# Patient Record
Sex: Male | Born: 1935
Health system: Southern US, Community
[De-identification: ages and names within clinical notes are randomized; demographics above are authoritative.]

## PROBLEM LIST (undated history)

## (undated) DIAGNOSIS — I2510 Atherosclerotic heart disease of native coronary artery without angina pectoris: Secondary | ICD-10-CM

## (undated) DIAGNOSIS — I219 Acute myocardial infarction, unspecified: Secondary | ICD-10-CM

## (undated) DIAGNOSIS — N200 Calculus of kidney: Secondary | ICD-10-CM

## (undated) HISTORY — PX: CARDIAC SURGERY: SHX584

## (undated) HISTORY — PX: ABDOMINAL SURGERY: SHX537

---

## 2005-09-14 ENCOUNTER — Emergency Department: Payer: Self-pay | Admitting: Emergency Medicine

## 2007-05-08 ENCOUNTER — Ambulatory Visit: Payer: Self-pay | Admitting: Gastroenterology

## 2007-05-09 ENCOUNTER — Other Ambulatory Visit: Payer: Self-pay

## 2007-05-10 ENCOUNTER — Inpatient Hospital Stay: Payer: Self-pay | Admitting: Internal Medicine

## 2007-05-12 ENCOUNTER — Other Ambulatory Visit: Payer: Self-pay

## 2010-12-31 ENCOUNTER — Ambulatory Visit: Payer: Self-pay | Admitting: Family

## 2011-01-04 ENCOUNTER — Ambulatory Visit: Payer: Self-pay | Admitting: Unknown Physician Specialty

## 2011-08-10 DIAGNOSIS — R972 Elevated prostate specific antigen [PSA]: Secondary | ICD-10-CM | POA: Diagnosis not present

## 2011-08-10 DIAGNOSIS — Z85828 Personal history of other malignant neoplasm of skin: Secondary | ICD-10-CM | POA: Diagnosis not present

## 2011-08-10 DIAGNOSIS — D485 Neoplasm of uncertain behavior of skin: Secondary | ICD-10-CM | POA: Diagnosis not present

## 2011-08-10 DIAGNOSIS — J9819 Other pulmonary collapse: Secondary | ICD-10-CM | POA: Diagnosis not present

## 2011-08-10 DIAGNOSIS — L570 Actinic keratosis: Secondary | ICD-10-CM | POA: Diagnosis not present

## 2011-09-29 DIAGNOSIS — L908 Other atrophic disorders of skin: Secondary | ICD-10-CM | POA: Diagnosis not present

## 2011-09-29 DIAGNOSIS — C44320 Squamous cell carcinoma of skin of unspecified parts of face: Secondary | ICD-10-CM | POA: Diagnosis not present

## 2011-09-29 DIAGNOSIS — Z85828 Personal history of other malignant neoplasm of skin: Secondary | ICD-10-CM | POA: Diagnosis not present

## 2011-11-26 DIAGNOSIS — E785 Hyperlipidemia, unspecified: Secondary | ICD-10-CM | POA: Diagnosis not present

## 2011-11-26 DIAGNOSIS — I10 Essential (primary) hypertension: Secondary | ICD-10-CM | POA: Diagnosis not present

## 2012-02-24 DIAGNOSIS — D649 Anemia, unspecified: Secondary | ICD-10-CM | POA: Diagnosis not present

## 2012-02-24 DIAGNOSIS — E039 Hypothyroidism, unspecified: Secondary | ICD-10-CM | POA: Diagnosis not present

## 2012-02-24 DIAGNOSIS — Z125 Encounter for screening for malignant neoplasm of prostate: Secondary | ICD-10-CM | POA: Diagnosis not present

## 2012-02-24 DIAGNOSIS — E785 Hyperlipidemia, unspecified: Secondary | ICD-10-CM | POA: Diagnosis not present

## 2012-02-24 DIAGNOSIS — E7800 Pure hypercholesterolemia, unspecified: Secondary | ICD-10-CM | POA: Diagnosis not present

## 2012-02-28 DIAGNOSIS — M79609 Pain in unspecified limb: Secondary | ICD-10-CM | POA: Diagnosis not present

## 2012-02-28 DIAGNOSIS — S91009A Unspecified open wound, unspecified ankle, initial encounter: Secondary | ICD-10-CM | POA: Diagnosis not present

## 2012-02-28 DIAGNOSIS — M7989 Other specified soft tissue disorders: Secondary | ICD-10-CM | POA: Diagnosis not present

## 2012-02-29 DIAGNOSIS — I209 Angina pectoris, unspecified: Secondary | ICD-10-CM | POA: Diagnosis not present

## 2012-02-29 DIAGNOSIS — H1045 Other chronic allergic conjunctivitis: Secondary | ICD-10-CM | POA: Diagnosis not present

## 2012-02-29 DIAGNOSIS — E785 Hyperlipidemia, unspecified: Secondary | ICD-10-CM | POA: Diagnosis not present

## 2012-02-29 DIAGNOSIS — I499 Cardiac arrhythmia, unspecified: Secondary | ICD-10-CM | POA: Diagnosis not present

## 2012-04-04 DIAGNOSIS — I499 Cardiac arrhythmia, unspecified: Secondary | ICD-10-CM | POA: Diagnosis not present

## 2012-05-01 DIAGNOSIS — L570 Actinic keratosis: Secondary | ICD-10-CM | POA: Diagnosis not present

## 2012-05-01 DIAGNOSIS — Z85828 Personal history of other malignant neoplasm of skin: Secondary | ICD-10-CM | POA: Diagnosis not present

## 2012-06-26 DIAGNOSIS — D075 Carcinoma in situ of prostate: Secondary | ICD-10-CM | POA: Diagnosis not present

## 2012-06-26 DIAGNOSIS — R972 Elevated prostate specific antigen [PSA]: Secondary | ICD-10-CM | POA: Diagnosis not present

## 2012-06-26 DIAGNOSIS — N400 Enlarged prostate without lower urinary tract symptoms: Secondary | ICD-10-CM | POA: Diagnosis not present

## 2012-07-31 DIAGNOSIS — I499 Cardiac arrhythmia, unspecified: Secondary | ICD-10-CM | POA: Diagnosis not present

## 2012-07-31 DIAGNOSIS — I209 Angina pectoris, unspecified: Secondary | ICD-10-CM | POA: Diagnosis not present

## 2012-07-31 DIAGNOSIS — I10 Essential (primary) hypertension: Secondary | ICD-10-CM | POA: Diagnosis not present

## 2012-07-31 DIAGNOSIS — E785 Hyperlipidemia, unspecified: Secondary | ICD-10-CM | POA: Diagnosis not present

## 2012-08-16 DIAGNOSIS — I499 Cardiac arrhythmia, unspecified: Secondary | ICD-10-CM | POA: Diagnosis not present

## 2012-08-16 DIAGNOSIS — E785 Hyperlipidemia, unspecified: Secondary | ICD-10-CM | POA: Diagnosis not present

## 2012-08-16 DIAGNOSIS — I10 Essential (primary) hypertension: Secondary | ICD-10-CM | POA: Diagnosis not present

## 2012-08-17 DIAGNOSIS — I499 Cardiac arrhythmia, unspecified: Secondary | ICD-10-CM | POA: Diagnosis not present

## 2012-08-30 DIAGNOSIS — H2510 Age-related nuclear cataract, unspecified eye: Secondary | ICD-10-CM | POA: Diagnosis not present

## 2012-11-24 DIAGNOSIS — I10 Essential (primary) hypertension: Secondary | ICD-10-CM | POA: Diagnosis not present

## 2012-11-24 DIAGNOSIS — I209 Angina pectoris, unspecified: Secondary | ICD-10-CM | POA: Diagnosis not present

## 2012-11-24 DIAGNOSIS — E785 Hyperlipidemia, unspecified: Secondary | ICD-10-CM | POA: Diagnosis not present

## 2012-11-24 DIAGNOSIS — I499 Cardiac arrhythmia, unspecified: Secondary | ICD-10-CM | POA: Diagnosis not present

## 2012-11-30 DIAGNOSIS — N410 Acute prostatitis: Secondary | ICD-10-CM | POA: Diagnosis not present

## 2012-11-30 DIAGNOSIS — T887XXA Unspecified adverse effect of drug or medicament, initial encounter: Secondary | ICD-10-CM | POA: Diagnosis not present

## 2012-11-30 DIAGNOSIS — I10 Essential (primary) hypertension: Secondary | ICD-10-CM | POA: Diagnosis not present

## 2012-11-30 DIAGNOSIS — E785 Hyperlipidemia, unspecified: Secondary | ICD-10-CM | POA: Diagnosis not present

## 2012-12-12 DIAGNOSIS — E785 Hyperlipidemia, unspecified: Secondary | ICD-10-CM | POA: Diagnosis not present

## 2012-12-12 DIAGNOSIS — Z0000 Encounter for general adult medical examination without abnormal findings: Secondary | ICD-10-CM | POA: Diagnosis not present

## 2012-12-12 DIAGNOSIS — I2510 Atherosclerotic heart disease of native coronary artery without angina pectoris: Secondary | ICD-10-CM | POA: Diagnosis not present

## 2013-04-09 DIAGNOSIS — I209 Angina pectoris, unspecified: Secondary | ICD-10-CM | POA: Diagnosis not present

## 2013-04-09 DIAGNOSIS — I2510 Atherosclerotic heart disease of native coronary artery without angina pectoris: Secondary | ICD-10-CM | POA: Diagnosis not present

## 2013-04-09 DIAGNOSIS — I10 Essential (primary) hypertension: Secondary | ICD-10-CM | POA: Diagnosis not present

## 2013-04-09 DIAGNOSIS — Z9861 Coronary angioplasty status: Secondary | ICD-10-CM | POA: Diagnosis not present

## 2013-04-20 DIAGNOSIS — R0602 Shortness of breath: Secondary | ICD-10-CM | POA: Diagnosis not present

## 2013-05-01 DIAGNOSIS — L570 Actinic keratosis: Secondary | ICD-10-CM | POA: Diagnosis not present

## 2013-05-01 DIAGNOSIS — Z85828 Personal history of other malignant neoplasm of skin: Secondary | ICD-10-CM | POA: Diagnosis not present

## 2013-05-01 DIAGNOSIS — D485 Neoplasm of uncertain behavior of skin: Secondary | ICD-10-CM | POA: Diagnosis not present

## 2013-05-07 DIAGNOSIS — L570 Actinic keratosis: Secondary | ICD-10-CM | POA: Diagnosis not present

## 2013-06-19 DIAGNOSIS — I499 Cardiac arrhythmia, unspecified: Secondary | ICD-10-CM | POA: Diagnosis not present

## 2013-06-19 DIAGNOSIS — I10 Essential (primary) hypertension: Secondary | ICD-10-CM | POA: Diagnosis not present

## 2013-06-19 DIAGNOSIS — I2510 Atherosclerotic heart disease of native coronary artery without angina pectoris: Secondary | ICD-10-CM | POA: Diagnosis not present

## 2013-06-19 DIAGNOSIS — E785 Hyperlipidemia, unspecified: Secondary | ICD-10-CM | POA: Diagnosis not present

## 2013-06-28 DIAGNOSIS — C44320 Squamous cell carcinoma of skin of unspecified parts of face: Secondary | ICD-10-CM | POA: Diagnosis not present

## 2013-08-21 DIAGNOSIS — E785 Hyperlipidemia, unspecified: Secondary | ICD-10-CM | POA: Diagnosis not present

## 2013-08-21 DIAGNOSIS — I209 Angina pectoris, unspecified: Secondary | ICD-10-CM | POA: Diagnosis not present

## 2013-08-21 DIAGNOSIS — I10 Essential (primary) hypertension: Secondary | ICD-10-CM | POA: Diagnosis not present

## 2013-09-05 DIAGNOSIS — L570 Actinic keratosis: Secondary | ICD-10-CM | POA: Diagnosis not present

## 2013-10-03 DIAGNOSIS — L570 Actinic keratosis: Secondary | ICD-10-CM | POA: Diagnosis not present

## 2013-10-29 DIAGNOSIS — Z85828 Personal history of other malignant neoplasm of skin: Secondary | ICD-10-CM | POA: Diagnosis not present

## 2013-10-29 DIAGNOSIS — D485 Neoplasm of uncertain behavior of skin: Secondary | ICD-10-CM | POA: Diagnosis not present

## 2013-10-29 DIAGNOSIS — L905 Scar conditions and fibrosis of skin: Secondary | ICD-10-CM | POA: Diagnosis not present

## 2013-10-29 DIAGNOSIS — L851 Acquired keratosis [keratoderma] palmaris et plantaris: Secondary | ICD-10-CM | POA: Diagnosis not present

## 2013-10-29 DIAGNOSIS — L821 Other seborrheic keratosis: Secondary | ICD-10-CM | POA: Diagnosis not present

## 2013-11-26 DIAGNOSIS — J218 Acute bronchiolitis due to other specified organisms: Secondary | ICD-10-CM | POA: Diagnosis not present

## 2013-11-26 DIAGNOSIS — J3089 Other allergic rhinitis: Secondary | ICD-10-CM | POA: Diagnosis not present

## 2014-01-16 DIAGNOSIS — D0460 Carcinoma in situ of skin of unspecified upper limb, including shoulder: Secondary | ICD-10-CM | POA: Diagnosis not present

## 2014-02-19 DIAGNOSIS — Z0000 Encounter for general adult medical examination without abnormal findings: Secondary | ICD-10-CM | POA: Diagnosis not present

## 2014-02-19 DIAGNOSIS — E785 Hyperlipidemia, unspecified: Secondary | ICD-10-CM | POA: Diagnosis not present

## 2014-02-19 DIAGNOSIS — R972 Elevated prostate specific antigen [PSA]: Secondary | ICD-10-CM | POA: Diagnosis not present

## 2014-02-26 DIAGNOSIS — I10 Essential (primary) hypertension: Secondary | ICD-10-CM | POA: Diagnosis not present

## 2014-02-26 DIAGNOSIS — I2510 Atherosclerotic heart disease of native coronary artery without angina pectoris: Secondary | ICD-10-CM | POA: Diagnosis not present

## 2014-02-26 DIAGNOSIS — R972 Elevated prostate specific antigen [PSA]: Secondary | ICD-10-CM | POA: Diagnosis not present

## 2014-02-26 DIAGNOSIS — E785 Hyperlipidemia, unspecified: Secondary | ICD-10-CM | POA: Diagnosis not present

## 2014-03-14 DIAGNOSIS — D0460 Carcinoma in situ of skin of unspecified upper limb, including shoulder: Secondary | ICD-10-CM | POA: Diagnosis not present

## 2014-03-14 DIAGNOSIS — R972 Elevated prostate specific antigen [PSA]: Secondary | ICD-10-CM | POA: Diagnosis not present

## 2014-05-30 DIAGNOSIS — I10 Essential (primary) hypertension: Secondary | ICD-10-CM | POA: Diagnosis not present

## 2014-05-30 DIAGNOSIS — I209 Angina pectoris, unspecified: Secondary | ICD-10-CM | POA: Diagnosis not present

## 2014-05-30 DIAGNOSIS — Z2821 Immunization not carried out because of patient refusal: Secondary | ICD-10-CM | POA: Diagnosis not present

## 2014-05-30 DIAGNOSIS — Z1389 Encounter for screening for other disorder: Secondary | ICD-10-CM | POA: Diagnosis not present

## 2014-07-11 DIAGNOSIS — D0439 Carcinoma in situ of skin of other parts of face: Secondary | ICD-10-CM | POA: Diagnosis not present

## 2014-07-11 DIAGNOSIS — D0461 Carcinoma in situ of skin of right upper limb, including shoulder: Secondary | ICD-10-CM | POA: Diagnosis not present

## 2014-07-11 DIAGNOSIS — X32XXXA Exposure to sunlight, initial encounter: Secondary | ICD-10-CM | POA: Diagnosis not present

## 2014-07-11 DIAGNOSIS — D485 Neoplasm of uncertain behavior of skin: Secondary | ICD-10-CM | POA: Diagnosis not present

## 2014-07-11 DIAGNOSIS — Z85828 Personal history of other malignant neoplasm of skin: Secondary | ICD-10-CM | POA: Diagnosis not present

## 2014-07-11 DIAGNOSIS — L570 Actinic keratosis: Secondary | ICD-10-CM | POA: Diagnosis not present

## 2014-08-26 DIAGNOSIS — D0461 Carcinoma in situ of skin of right upper limb, including shoulder: Secondary | ICD-10-CM | POA: Diagnosis not present

## 2014-08-30 DIAGNOSIS — I2510 Atherosclerotic heart disease of native coronary artery without angina pectoris: Secondary | ICD-10-CM | POA: Diagnosis not present

## 2014-08-30 DIAGNOSIS — E784 Other hyperlipidemia: Secondary | ICD-10-CM | POA: Diagnosis not present

## 2014-08-30 DIAGNOSIS — I10 Essential (primary) hypertension: Secondary | ICD-10-CM | POA: Diagnosis not present

## 2014-08-30 DIAGNOSIS — I209 Angina pectoris, unspecified: Secondary | ICD-10-CM | POA: Diagnosis not present

## 2014-09-12 DIAGNOSIS — L570 Actinic keratosis: Secondary | ICD-10-CM | POA: Diagnosis not present

## 2014-09-12 DIAGNOSIS — L578 Other skin changes due to chronic exposure to nonionizing radiation: Secondary | ICD-10-CM | POA: Diagnosis not present

## 2014-09-12 DIAGNOSIS — Z85828 Personal history of other malignant neoplasm of skin: Secondary | ICD-10-CM | POA: Diagnosis not present

## 2014-09-12 DIAGNOSIS — D099 Carcinoma in situ, unspecified: Secondary | ICD-10-CM | POA: Diagnosis not present

## 2014-09-23 DIAGNOSIS — R972 Elevated prostate specific antigen [PSA]: Secondary | ICD-10-CM | POA: Diagnosis not present

## 2014-09-23 DIAGNOSIS — Z6828 Body mass index (BMI) 28.0-28.9, adult: Secondary | ICD-10-CM | POA: Diagnosis not present

## 2014-09-23 DIAGNOSIS — N423 Dysplasia of prostate: Secondary | ICD-10-CM | POA: Diagnosis not present

## 2014-09-30 DIAGNOSIS — I10 Essential (primary) hypertension: Secondary | ICD-10-CM | POA: Diagnosis not present

## 2014-09-30 DIAGNOSIS — I2510 Atherosclerotic heart disease of native coronary artery without angina pectoris: Secondary | ICD-10-CM | POA: Diagnosis not present

## 2014-09-30 DIAGNOSIS — I209 Angina pectoris, unspecified: Secondary | ICD-10-CM | POA: Diagnosis not present

## 2014-10-25 DIAGNOSIS — Z23 Encounter for immunization: Secondary | ICD-10-CM | POA: Diagnosis not present

## 2014-12-30 DIAGNOSIS — I209 Angina pectoris, unspecified: Secondary | ICD-10-CM | POA: Diagnosis not present

## 2014-12-30 DIAGNOSIS — I2510 Atherosclerotic heart disease of native coronary artery without angina pectoris: Secondary | ICD-10-CM | POA: Diagnosis not present

## 2014-12-30 DIAGNOSIS — I10 Essential (primary) hypertension: Secondary | ICD-10-CM | POA: Diagnosis not present

## 2014-12-31 DIAGNOSIS — Z85828 Personal history of other malignant neoplasm of skin: Secondary | ICD-10-CM | POA: Diagnosis not present

## 2014-12-31 DIAGNOSIS — L570 Actinic keratosis: Secondary | ICD-10-CM | POA: Diagnosis not present

## 2014-12-31 DIAGNOSIS — X32XXXA Exposure to sunlight, initial encounter: Secondary | ICD-10-CM | POA: Diagnosis not present

## 2014-12-31 DIAGNOSIS — D2261 Melanocytic nevi of right upper limb, including shoulder: Secondary | ICD-10-CM | POA: Diagnosis not present

## 2014-12-31 DIAGNOSIS — D2272 Melanocytic nevi of left lower limb, including hip: Secondary | ICD-10-CM | POA: Diagnosis not present

## 2014-12-31 DIAGNOSIS — L821 Other seborrheic keratosis: Secondary | ICD-10-CM | POA: Diagnosis not present

## 2015-01-06 DIAGNOSIS — R972 Elevated prostate specific antigen [PSA]: Secondary | ICD-10-CM | POA: Diagnosis not present

## 2015-04-07 DIAGNOSIS — I2510 Atherosclerotic heart disease of native coronary artery without angina pectoris: Secondary | ICD-10-CM | POA: Diagnosis not present

## 2015-04-07 DIAGNOSIS — I209 Angina pectoris, unspecified: Secondary | ICD-10-CM | POA: Diagnosis not present

## 2015-04-07 DIAGNOSIS — Z23 Encounter for immunization: Secondary | ICD-10-CM | POA: Diagnosis not present

## 2015-04-10 DIAGNOSIS — E784 Other hyperlipidemia: Secondary | ICD-10-CM | POA: Diagnosis not present

## 2015-04-10 DIAGNOSIS — Z125 Encounter for screening for malignant neoplasm of prostate: Secondary | ICD-10-CM | POA: Diagnosis not present

## 2015-04-10 DIAGNOSIS — I10 Essential (primary) hypertension: Secondary | ICD-10-CM | POA: Diagnosis not present

## 2015-04-21 DIAGNOSIS — I10 Essential (primary) hypertension: Secondary | ICD-10-CM | POA: Diagnosis not present

## 2015-04-21 DIAGNOSIS — I498 Other specified cardiac arrhythmias: Secondary | ICD-10-CM | POA: Diagnosis not present

## 2015-04-21 DIAGNOSIS — I2510 Atherosclerotic heart disease of native coronary artery without angina pectoris: Secondary | ICD-10-CM | POA: Diagnosis not present

## 2015-04-21 DIAGNOSIS — E784 Other hyperlipidemia: Secondary | ICD-10-CM | POA: Diagnosis not present

## 2015-05-05 DIAGNOSIS — Z0000 Encounter for general adult medical examination without abnormal findings: Secondary | ICD-10-CM | POA: Diagnosis not present

## 2015-05-14 DIAGNOSIS — J069 Acute upper respiratory infection, unspecified: Secondary | ICD-10-CM | POA: Diagnosis not present

## 2015-05-14 DIAGNOSIS — H6122 Impacted cerumen, left ear: Secondary | ICD-10-CM | POA: Diagnosis not present

## 2015-05-14 DIAGNOSIS — R0982 Postnasal drip: Secondary | ICD-10-CM | POA: Diagnosis not present

## 2015-07-08 DIAGNOSIS — Z08 Encounter for follow-up examination after completed treatment for malignant neoplasm: Secondary | ICD-10-CM | POA: Diagnosis not present

## 2015-07-08 DIAGNOSIS — L820 Inflamed seborrheic keratosis: Secondary | ICD-10-CM | POA: Diagnosis not present

## 2015-07-08 DIAGNOSIS — Z85828 Personal history of other malignant neoplasm of skin: Secondary | ICD-10-CM | POA: Diagnosis not present

## 2015-07-08 DIAGNOSIS — D485 Neoplasm of uncertain behavior of skin: Secondary | ICD-10-CM | POA: Diagnosis not present

## 2015-07-08 DIAGNOSIS — X32XXXA Exposure to sunlight, initial encounter: Secondary | ICD-10-CM | POA: Diagnosis not present

## 2015-07-08 DIAGNOSIS — L570 Actinic keratosis: Secondary | ICD-10-CM | POA: Diagnosis not present

## 2015-08-05 DIAGNOSIS — I2510 Atherosclerotic heart disease of native coronary artery without angina pectoris: Secondary | ICD-10-CM | POA: Diagnosis not present

## 2015-08-05 DIAGNOSIS — I10 Essential (primary) hypertension: Secondary | ICD-10-CM | POA: Diagnosis not present

## 2015-08-05 DIAGNOSIS — E784 Other hyperlipidemia: Secondary | ICD-10-CM | POA: Diagnosis not present

## 2015-08-05 DIAGNOSIS — I209 Angina pectoris, unspecified: Secondary | ICD-10-CM | POA: Diagnosis not present

## 2015-08-12 DIAGNOSIS — I119 Hypertensive heart disease without heart failure: Secondary | ICD-10-CM | POA: Diagnosis not present

## 2015-08-12 DIAGNOSIS — I209 Angina pectoris, unspecified: Secondary | ICD-10-CM | POA: Diagnosis not present

## 2015-08-12 DIAGNOSIS — E784 Other hyperlipidemia: Secondary | ICD-10-CM | POA: Diagnosis not present

## 2015-08-12 DIAGNOSIS — I429 Cardiomyopathy, unspecified: Secondary | ICD-10-CM | POA: Diagnosis not present

## 2015-08-12 DIAGNOSIS — I10 Essential (primary) hypertension: Secondary | ICD-10-CM | POA: Diagnosis not present

## 2015-08-14 DIAGNOSIS — I209 Angina pectoris, unspecified: Secondary | ICD-10-CM | POA: Diagnosis not present

## 2015-08-14 DIAGNOSIS — E784 Other hyperlipidemia: Secondary | ICD-10-CM | POA: Diagnosis not present

## 2015-08-14 DIAGNOSIS — I429 Cardiomyopathy, unspecified: Secondary | ICD-10-CM | POA: Diagnosis not present

## 2015-08-14 DIAGNOSIS — I119 Hypertensive heart disease without heart failure: Secondary | ICD-10-CM | POA: Diagnosis not present

## 2015-09-01 DIAGNOSIS — I209 Angina pectoris, unspecified: Secondary | ICD-10-CM | POA: Diagnosis not present

## 2015-09-01 DIAGNOSIS — B9689 Other specified bacterial agents as the cause of diseases classified elsewhere: Secondary | ICD-10-CM | POA: Diagnosis not present

## 2015-09-01 DIAGNOSIS — J028 Acute pharyngitis due to other specified organisms: Secondary | ICD-10-CM | POA: Diagnosis not present

## 2015-09-01 DIAGNOSIS — I2510 Atherosclerotic heart disease of native coronary artery without angina pectoris: Secondary | ICD-10-CM | POA: Diagnosis not present

## 2015-10-21 DIAGNOSIS — N4231 Prostatic intraepithelial neoplasia: Secondary | ICD-10-CM | POA: Diagnosis not present

## 2015-10-21 DIAGNOSIS — R972 Elevated prostate specific antigen [PSA]: Secondary | ICD-10-CM | POA: Diagnosis not present

## 2015-10-21 DIAGNOSIS — Z6829 Body mass index (BMI) 29.0-29.9, adult: Secondary | ICD-10-CM | POA: Diagnosis not present

## 2015-11-11 DIAGNOSIS — I10 Essential (primary) hypertension: Secondary | ICD-10-CM | POA: Diagnosis not present

## 2015-11-11 DIAGNOSIS — L439 Lichen planus, unspecified: Secondary | ICD-10-CM | POA: Diagnosis not present

## 2015-11-11 DIAGNOSIS — I2510 Atherosclerotic heart disease of native coronary artery without angina pectoris: Secondary | ICD-10-CM | POA: Diagnosis not present

## 2015-11-11 DIAGNOSIS — I209 Angina pectoris, unspecified: Secondary | ICD-10-CM | POA: Diagnosis not present

## 2016-02-25 DIAGNOSIS — R972 Elevated prostate specific antigen [PSA]: Secondary | ICD-10-CM | POA: Diagnosis not present

## 2016-03-01 DIAGNOSIS — I10 Essential (primary) hypertension: Secondary | ICD-10-CM | POA: Diagnosis not present

## 2016-03-01 DIAGNOSIS — I209 Angina pectoris, unspecified: Secondary | ICD-10-CM | POA: Diagnosis not present

## 2016-03-01 DIAGNOSIS — E784 Other hyperlipidemia: Secondary | ICD-10-CM | POA: Diagnosis not present

## 2016-03-01 DIAGNOSIS — J399 Disease of upper respiratory tract, unspecified: Secondary | ICD-10-CM | POA: Diagnosis not present

## 2016-03-10 DIAGNOSIS — H90A22 Sensorineural hearing loss, unilateral, left ear, with restricted hearing on the contralateral side: Secondary | ICD-10-CM | POA: Diagnosis not present

## 2016-03-10 DIAGNOSIS — H6122 Impacted cerumen, left ear: Secondary | ICD-10-CM | POA: Diagnosis not present

## 2016-04-21 DIAGNOSIS — X32XXXA Exposure to sunlight, initial encounter: Secondary | ICD-10-CM | POA: Diagnosis not present

## 2016-04-21 DIAGNOSIS — D0439 Carcinoma in situ of skin of other parts of face: Secondary | ICD-10-CM | POA: Diagnosis not present

## 2016-04-21 DIAGNOSIS — D485 Neoplasm of uncertain behavior of skin: Secondary | ICD-10-CM | POA: Diagnosis not present

## 2016-04-21 DIAGNOSIS — Z08 Encounter for follow-up examination after completed treatment for malignant neoplasm: Secondary | ICD-10-CM | POA: Diagnosis not present

## 2016-04-21 DIAGNOSIS — Z85828 Personal history of other malignant neoplasm of skin: Secondary | ICD-10-CM | POA: Diagnosis not present

## 2016-04-21 DIAGNOSIS — L570 Actinic keratosis: Secondary | ICD-10-CM | POA: Diagnosis not present

## 2016-05-20 DIAGNOSIS — L905 Scar conditions and fibrosis of skin: Secondary | ICD-10-CM | POA: Diagnosis not present

## 2016-05-20 DIAGNOSIS — D0439 Carcinoma in situ of skin of other parts of face: Secondary | ICD-10-CM | POA: Diagnosis not present

## 2016-05-31 DIAGNOSIS — I10 Essential (primary) hypertension: Secondary | ICD-10-CM | POA: Diagnosis not present

## 2016-05-31 DIAGNOSIS — E784 Other hyperlipidemia: Secondary | ICD-10-CM | POA: Diagnosis not present

## 2016-05-31 DIAGNOSIS — I2510 Atherosclerotic heart disease of native coronary artery without angina pectoris: Secondary | ICD-10-CM | POA: Diagnosis not present

## 2016-05-31 DIAGNOSIS — I498 Other specified cardiac arrhythmias: Secondary | ICD-10-CM | POA: Diagnosis not present

## 2016-06-13 ENCOUNTER — Encounter: Payer: Self-pay | Admitting: Emergency Medicine

## 2016-06-13 ENCOUNTER — Emergency Department
Admission: EM | Admit: 2016-06-13 | Discharge: 2016-06-13 | Disposition: A | Discharge status: Home / Self Care | Payer: Medicare Other | Attending: Emergency Medicine | Admitting: Emergency Medicine

## 2016-06-13 ENCOUNTER — Other Ambulatory Visit: Payer: Self-pay

## 2016-06-13 ENCOUNTER — Emergency Department: Payer: Medicare Other

## 2016-06-13 DIAGNOSIS — I2510 Atherosclerotic heart disease of native coronary artery without angina pectoris: Secondary | ICD-10-CM | POA: Diagnosis not present

## 2016-06-13 DIAGNOSIS — R42 Dizziness and giddiness: Secondary | ICD-10-CM | POA: Diagnosis not present

## 2016-06-13 DIAGNOSIS — R112 Nausea with vomiting, unspecified: Secondary | ICD-10-CM | POA: Insufficient documentation

## 2016-06-13 HISTORY — DX: Acute myocardial infarction, unspecified: I21.9

## 2016-06-13 HISTORY — DX: Calculus of kidney: N20.0

## 2016-06-13 HISTORY — DX: Atherosclerotic heart disease of native coronary artery without angina pectoris: I25.10

## 2016-06-13 LAB — BASIC METABOLIC PANEL
ANION GAP: 7 (ref 5–15)
BUN: 12 mg/dL (ref 6–20)
CHLORIDE: 106 mmol/L (ref 101–111)
CO2: 26 mmol/L (ref 22–32)
Calcium: 9.5 mg/dL (ref 8.9–10.3)
Creatinine, Ser: 0.92 mg/dL (ref 0.61–1.24)
GFR calc Af Amer: >60 mL/min (ref >60)
GLUCOSE: 118 mg/dL — AB (ref 65–99)
POTASSIUM: 4.6 mmol/L (ref 3.5–5.1)
Sodium: 139 mmol/L (ref 135–145)

## 2016-06-13 LAB — CBC
HEMATOCRIT: 48.3 % (ref 40.0–52.0)
HEMOGLOBIN: 16.7 g/dL (ref 13.0–18.0)
MCH: 33.7 pg (ref 26.0–34.0)
MCHC: 34.6 g/dL (ref 32.0–36.0)
MCV: 97.4 fL (ref 80.0–100.0)
Platelets: 241 10*3/uL (ref 150–440)
RBC: 4.96 MIL/uL (ref 4.40–5.90)
RDW: 13.7 % (ref 11.5–14.5)
WBC: 6.4 10*3/uL (ref 3.8–10.6)

## 2016-06-13 MED ORDER — MECLIZINE HCL 25 MG PO TABS: 25.0000 mg | ORAL_TABLET | Freq: Three times a day (TID) | ORAL | 0 refills | Status: DC | PRN

## 2016-06-13 MED ORDER — MECLIZINE HCL 25 MG PO TABS
25.0000 mg | ORAL_TABLET | Freq: Once | ORAL | Status: AC
  Administered 2016-06-13: 25 mg via ORAL
  Filled 2016-06-13: qty 1

## 2016-06-13 MED ORDER — ONDANSETRON HCL 4 MG/2ML IJ SOLN
4.0000 mg | Freq: Once | INTRAMUSCULAR | Status: AC
  Administered 2016-06-13: 4 mg via INTRAVENOUS

## 2016-06-13 MED ORDER — ONDANSETRON HCL 4 MG/2ML IJ SOLN
INTRAMUSCULAR | Status: AC
  Administered 2016-06-13: 4 mg via INTRAVENOUS
  Filled 2016-06-13: qty 2

## 2016-06-13 MED ORDER — SODIUM CHLORIDE 0.9 % IV SOLN
1000.0000 mL | Freq: Once | INTRAVENOUS | Status: AC
  Administered 2016-06-13: 1000 mL via INTRAVENOUS

## 2016-06-13 NOTE — ED Notes (Signed)
Pt ambulatory with 1 assist, pt denies any dizziness or nausea . MD notified

## 2016-06-13 NOTE — ED Notes (Addendum)
Pt c/o dizziness with movement, nausea and vomiting started aprox 5am. Pt had episode of dizziness last week but resolved on its own. Pt A&Ox4. Pt currently vomiting, verbal order given for zofran from ED provider

## 2016-06-13 NOTE — ED Notes (Signed)
Pt states nausea has improved from zofran given

## 2016-06-13 NOTE — ED Provider Notes (Signed)
[facility] Emergency Department Provider Note   ____________________________________________    I have reviewed the triage vital signs and the nursing notes.   HISTORY  Chief Complaint Dizziness and Emesis     HPI Travis Moore is a 82 y.o. male who presents with complaints of dizziness. Patient reports when he woke up this morning he was unable to get out of bed because he felt lightheaded and like the room was spinning. He decided to lie back down for another hour and then he was able to get up and felt better. He ate breakfast but then reports the sensation of spinning started again. He has never had vertigo before. He denies headache. No neuro deficits. No fevers or chills. No neck pain.   Past Medical History:  Diagnosis Date  . Coronary artery disease   . Myocardial infarction   . Nephrolithiasis     There are no active problems to display for this patient.   Past Surgical History:  Procedure Laterality Date  . ABDOMINAL SURGERY    . CARDIAC SURGERY      Prior to Admission medications   Medication Sig Start Date End Date Taking? Authorizing Provider  meclizine (ANTIVERT) 25 MG tablet Take 1 tablet (25 mg total) by mouth 3 (three) times daily as needed for dizziness. 06/13/16   Lavonia Drafts, MD     Allergies Patient has no known allergies.  History reviewed. No pertinent family history.  Social History Social History  Substance Use Topics  . Smoking status: Never Smoker  . Smokeless tobacco: Never Used  . Alcohol use No    Review of Systems  Constitutional: No fever/chills Eyes: No visual changes.  ENT: No Neck pain Cardiovascular: Denies chest pain. No palpitations Respiratory: Denies shortness of breath. Gastrointestinal: One episode of vomiting  Musculoskeletal: Negative for back pain. Skin: Negative for rash. Neurological: Negative for headaches or weakness  10-point ROS otherwise  negative.  ____________________________________________   PHYSICAL EXAM:  VITAL SIGNS: ED Triage Vitals  Enc Vitals Group     BP 06/13/16 1230 124/80     Pulse Rate 06/13/16 1245 63     Resp 06/13/16 1216 (!) 24     Temp --      Temp src --      SpO2 06/13/16 1245 96 %     Weight 06/13/16 1158 213 lb (96.6 kg)     Height 06/13/16 1158 5\' 10"  (1.778 m)     Head Circumference --      Peak Flow --      Pain Score 06/13/16 1158 0     Pain Loc --      Pain Edu? --      Excl. in [place]? --     Constitutional: Alert and oriented. No acute distress. Pleasant and interactive Eyes: Conjunctivae are normal. PERRLA, EOMI Nose: No congestion/rhinnorhea. Mouth/Throat: Mucous membranes are moist.   Neck:  Painless ROM Cardiovascular: Normal rate, regular rhythm. Grossly normal heart sounds.  Good peripheral circulation. Respiratory: Normal respiratory effort.  No retractions. Lungs CTAB. Gastrointestinal: Soft and nontender. No distention.   Genitourinary: deferred Musculoskeletal: No lower extremity tenderness Warm and well perfused Neurologic:  Normal speech and language. No gross focal neurologic deficits are appreciated. CN 2-12 normal Skin:  Skin is warm, dry and intact. No rash noted. Psychiatric: Mood and affect are normal. Speech and behavior are normal.  ____________________________________________   LABS (all labs ordered are listed, but only abnormal results are  displayed)  Labs Reviewed  BASIC METABOLIC PANEL - Abnormal; Notable for the following:       Result Value   Glucose, Bld 118 (*)    All other components within normal limits  CBC  URINALYSIS, COMPLETE (UACMP) WITH MICROSCOPIC   ____________________________________________  EKG  ED ECG REPORT I, Lavonia Drafts, the attending physician, personally viewed and interpreted this ECG.  Date: 06/13/2016 EKG Time: 12:03 pm Rate: 62 Rhythm: normal sinus rhythm QRS Axis: normal Intervals: normal ST/T Wave  abnormalities: normal Conduction Disturbances: Right bundle branch block   ____________________________________________  RADIOLOGY  CT head unremarkable ____________________________________________   PROCEDURES  Procedure(s) performed: No    Critical Care performed:No ____________________________________________   INITIAL IMPRESSION / ASSESSMENT AND PLAN / ED COURSE  Pertinent labs & imaging results that were available during my care of the patient were reviewed by me and considered in my medical decision making (see chart for details).  Patient overall well-appearing. Presents with dizziness/vertigo with an episode of vomiting. Differential includes BPV versus CVA versus idiopathic dizziness. No neuro deficits. We will treat with meclizine and IV fluids and reevaluate  Clinical Course   ----------------------------------------- 2:28 PM on 06/13/2016 -----------------------------------------  Patient reports he feels significantly better and is requesting to be discharged. He was able to ambulate easily in front of the nurse. I offered admission to the patient but he reports since he is feeling better he wants to go home. I feel this is reasonable given that he agrees to return immediately if the symptoms return. ____________________________________________   FINAL CLINICAL IMPRESSION(S) / ED DIAGNOSES  Final diagnoses:  Vertigo      NEW MEDICATIONS STARTED DURING THIS VISIT:  New Prescriptions   MECLIZINE (ANTIVERT) 25 MG TABLET    Take 1 tablet (25 mg total) by mouth 3 (three) times daily as needed for dizziness.     Note:  This document was prepared using Dragon voice recognition software and may include unintentional dictation errors.    Lavonia Drafts, MD 06/13/16 [phone]

## 2016-06-13 NOTE — ED Triage Notes (Signed)
Pt here with wife, reports dizziness and nausea since this morning, but previously was nauseated and dizzy one week ago.  Grandson last week also had diarrhea.  Pt reports dizziness worse with movement. Wife reports patient vomited up breakfast and had some blood tinge in emesis.

## 2016-06-13 NOTE — ED Notes (Signed)
Patient transported to CT 

## 2016-07-14 DIAGNOSIS — R42 Dizziness and giddiness: Secondary | ICD-10-CM | POA: Diagnosis not present

## 2016-07-20 DIAGNOSIS — R42 Dizziness and giddiness: Secondary | ICD-10-CM | POA: Diagnosis not present

## 2016-08-09 DIAGNOSIS — R42 Dizziness and giddiness: Secondary | ICD-10-CM | POA: Diagnosis not present

## 2016-08-11 DIAGNOSIS — H8192 Unspecified disorder of vestibular function, left ear: Secondary | ICD-10-CM | POA: Diagnosis not present

## 2016-08-30 DIAGNOSIS — I498 Other specified cardiac arrhythmias: Secondary | ICD-10-CM | POA: Diagnosis not present

## 2016-08-30 DIAGNOSIS — R0789 Other chest pain: Secondary | ICD-10-CM | POA: Diagnosis not present

## 2016-08-30 DIAGNOSIS — I2510 Atherosclerotic heart disease of native coronary artery without angina pectoris: Secondary | ICD-10-CM | POA: Diagnosis not present

## 2016-08-30 DIAGNOSIS — J399 Disease of upper respiratory tract, unspecified: Secondary | ICD-10-CM | POA: Diagnosis not present

## 2016-09-13 DIAGNOSIS — I498 Other specified cardiac arrhythmias: Secondary | ICD-10-CM | POA: Diagnosis not present

## 2016-09-13 DIAGNOSIS — J399 Disease of upper respiratory tract, unspecified: Secondary | ICD-10-CM | POA: Diagnosis not present

## 2016-09-13 DIAGNOSIS — I2510 Atherosclerotic heart disease of native coronary artery without angina pectoris: Secondary | ICD-10-CM | POA: Diagnosis not present

## 2016-09-13 DIAGNOSIS — R0789 Other chest pain: Secondary | ICD-10-CM | POA: Diagnosis not present

## 2016-09-15 DIAGNOSIS — I498 Other specified cardiac arrhythmias: Secondary | ICD-10-CM | POA: Diagnosis not present

## 2016-09-15 DIAGNOSIS — I209 Angina pectoris, unspecified: Secondary | ICD-10-CM | POA: Diagnosis not present

## 2016-09-15 DIAGNOSIS — R0602 Shortness of breath: Secondary | ICD-10-CM | POA: Diagnosis not present

## 2016-09-15 DIAGNOSIS — E784 Other hyperlipidemia: Secondary | ICD-10-CM | POA: Diagnosis not present

## 2016-09-22 DIAGNOSIS — L538 Other specified erythematous conditions: Secondary | ICD-10-CM | POA: Diagnosis not present

## 2016-09-22 DIAGNOSIS — L821 Other seborrheic keratosis: Secondary | ICD-10-CM | POA: Diagnosis not present

## 2016-09-22 DIAGNOSIS — D225 Melanocytic nevi of trunk: Secondary | ICD-10-CM | POA: Diagnosis not present

## 2016-09-22 DIAGNOSIS — X32XXXA Exposure to sunlight, initial encounter: Secondary | ICD-10-CM | POA: Diagnosis not present

## 2016-09-22 DIAGNOSIS — D485 Neoplasm of uncertain behavior of skin: Secondary | ICD-10-CM | POA: Diagnosis not present

## 2016-09-22 DIAGNOSIS — L570 Actinic keratosis: Secondary | ICD-10-CM | POA: Diagnosis not present

## 2016-09-22 DIAGNOSIS — C44329 Squamous cell carcinoma of skin of other parts of face: Secondary | ICD-10-CM | POA: Diagnosis not present

## 2016-09-22 DIAGNOSIS — L820 Inflamed seborrheic keratosis: Secondary | ICD-10-CM | POA: Diagnosis not present

## 2016-09-22 DIAGNOSIS — Z85828 Personal history of other malignant neoplasm of skin: Secondary | ICD-10-CM | POA: Diagnosis not present

## 2016-09-22 DIAGNOSIS — D2261 Melanocytic nevi of right upper limb, including shoulder: Secondary | ICD-10-CM | POA: Diagnosis not present

## 2016-10-20 DIAGNOSIS — R972 Elevated prostate specific antigen [PSA]: Secondary | ICD-10-CM | POA: Diagnosis not present

## 2016-10-20 DIAGNOSIS — N4231 Prostatic intraepithelial neoplasia: Secondary | ICD-10-CM | POA: Diagnosis not present

## 2016-10-20 DIAGNOSIS — Z6829 Body mass index (BMI) 29.0-29.9, adult: Secondary | ICD-10-CM | POA: Diagnosis not present

## 2016-11-17 DIAGNOSIS — C44329 Squamous cell carcinoma of skin of other parts of face: Secondary | ICD-10-CM | POA: Diagnosis not present

## 2016-11-17 DIAGNOSIS — D0422 Carcinoma in situ of skin of left ear and external auricular canal: Secondary | ICD-10-CM | POA: Diagnosis not present

## 2016-11-17 DIAGNOSIS — L905 Scar conditions and fibrosis of skin: Secondary | ICD-10-CM | POA: Diagnosis not present

## 2016-12-15 DIAGNOSIS — J00 Acute nasopharyngitis [common cold]: Secondary | ICD-10-CM | POA: Diagnosis not present

## 2016-12-15 DIAGNOSIS — J399 Disease of upper respiratory tract, unspecified: Secondary | ICD-10-CM | POA: Diagnosis not present

## 2016-12-15 DIAGNOSIS — I2510 Atherosclerotic heart disease of native coronary artery without angina pectoris: Secondary | ICD-10-CM | POA: Diagnosis not present

## 2016-12-15 DIAGNOSIS — I10 Essential (primary) hypertension: Secondary | ICD-10-CM | POA: Diagnosis not present

## 2017-02-16 DIAGNOSIS — Z08 Encounter for follow-up examination after completed treatment for malignant neoplasm: Secondary | ICD-10-CM | POA: Diagnosis not present

## 2017-02-16 DIAGNOSIS — L570 Actinic keratosis: Secondary | ICD-10-CM | POA: Diagnosis not present

## 2017-02-16 DIAGNOSIS — L821 Other seborrheic keratosis: Secondary | ICD-10-CM | POA: Diagnosis not present

## 2017-02-16 DIAGNOSIS — X32XXXA Exposure to sunlight, initial encounter: Secondary | ICD-10-CM | POA: Diagnosis not present

## 2017-02-16 DIAGNOSIS — Z85828 Personal history of other malignant neoplasm of skin: Secondary | ICD-10-CM | POA: Diagnosis not present

## 2017-02-28 DIAGNOSIS — R972 Elevated prostate specific antigen [PSA]: Secondary | ICD-10-CM | POA: Diagnosis not present

## 2017-03-16 ENCOUNTER — Ambulatory Visit
Admission: RE | Admit: 2017-03-16 | Discharge: 2017-03-16 | Disposition: A | Discharge status: Home / Self Care | Payer: Medicare Other | Source: Ambulatory Visit | Attending: Internal Medicine | Admitting: Internal Medicine

## 2017-03-16 ENCOUNTER — Other Ambulatory Visit: Payer: Self-pay | Admitting: Internal Medicine

## 2017-03-16 DIAGNOSIS — E784 Other hyperlipidemia: Secondary | ICD-10-CM | POA: Diagnosis not present

## 2017-03-16 DIAGNOSIS — R0602 Shortness of breath: Secondary | ICD-10-CM

## 2017-03-16 DIAGNOSIS — I498 Other specified cardiac arrhythmias: Secondary | ICD-10-CM | POA: Diagnosis not present

## 2017-03-16 DIAGNOSIS — J399 Disease of upper respiratory tract, unspecified: Secondary | ICD-10-CM | POA: Diagnosis not present

## 2017-03-16 DIAGNOSIS — I700 Atherosclerosis of aorta: Secondary | ICD-10-CM | POA: Insufficient documentation

## 2017-03-16 DIAGNOSIS — I10 Essential (primary) hypertension: Secondary | ICD-10-CM | POA: Diagnosis not present

## 2017-03-16 DIAGNOSIS — R918 Other nonspecific abnormal finding of lung field: Secondary | ICD-10-CM | POA: Diagnosis not present

## 2017-03-16 DIAGNOSIS — R5381 Other malaise: Secondary | ICD-10-CM | POA: Diagnosis not present

## 2017-03-16 DIAGNOSIS — I2510 Atherosclerotic heart disease of native coronary artery without angina pectoris: Secondary | ICD-10-CM | POA: Diagnosis not present

## 2017-03-16 DIAGNOSIS — Z125 Encounter for screening for malignant neoplasm of prostate: Secondary | ICD-10-CM | POA: Diagnosis not present

## 2017-06-24 DIAGNOSIS — I2510 Atherosclerotic heart disease of native coronary artery without angina pectoris: Secondary | ICD-10-CM | POA: Diagnosis not present

## 2017-06-24 DIAGNOSIS — I498 Other specified cardiac arrhythmias: Secondary | ICD-10-CM | POA: Diagnosis not present

## 2017-06-24 DIAGNOSIS — I10 Essential (primary) hypertension: Secondary | ICD-10-CM | POA: Diagnosis not present

## 2017-06-24 DIAGNOSIS — J399 Disease of upper respiratory tract, unspecified: Secondary | ICD-10-CM | POA: Diagnosis not present

## 2017-08-18 DIAGNOSIS — D485 Neoplasm of uncertain behavior of skin: Secondary | ICD-10-CM | POA: Diagnosis not present

## 2017-08-18 DIAGNOSIS — X32XXXA Exposure to sunlight, initial encounter: Secondary | ICD-10-CM | POA: Diagnosis not present

## 2017-08-18 DIAGNOSIS — D0439 Carcinoma in situ of skin of other parts of face: Secondary | ICD-10-CM | POA: Diagnosis not present

## 2017-08-18 DIAGNOSIS — Z08 Encounter for follow-up examination after completed treatment for malignant neoplasm: Secondary | ICD-10-CM | POA: Diagnosis not present

## 2017-08-18 DIAGNOSIS — Z85828 Personal history of other malignant neoplasm of skin: Secondary | ICD-10-CM | POA: Diagnosis not present

## 2017-08-18 DIAGNOSIS — L821 Other seborrheic keratosis: Secondary | ICD-10-CM | POA: Diagnosis not present

## 2017-08-18 DIAGNOSIS — L570 Actinic keratosis: Secondary | ICD-10-CM | POA: Diagnosis not present

## 2017-09-22 DIAGNOSIS — I10 Essential (primary) hypertension: Secondary | ICD-10-CM | POA: Diagnosis not present

## 2017-09-22 DIAGNOSIS — E785 Hyperlipidemia, unspecified: Secondary | ICD-10-CM | POA: Diagnosis not present

## 2017-09-22 DIAGNOSIS — I2510 Atherosclerotic heart disease of native coronary artery without angina pectoris: Secondary | ICD-10-CM | POA: Diagnosis not present

## 2017-09-22 DIAGNOSIS — I498 Other specified cardiac arrhythmias: Secondary | ICD-10-CM | POA: Diagnosis not present

## 2017-10-11 ENCOUNTER — Ambulatory Visit: Payer: Self-pay | Admitting: Urology

## 2017-10-18 DIAGNOSIS — D0439 Carcinoma in situ of skin of other parts of face: Secondary | ICD-10-CM | POA: Diagnosis not present

## 2017-10-27 ENCOUNTER — Ambulatory Visit (INDEPENDENT_AMBULATORY_CARE_PROVIDER_SITE_OTHER): Payer: Medicare Other | Admitting: Urology

## 2017-10-27 ENCOUNTER — Encounter: Payer: Self-pay | Admitting: Urology

## 2017-10-27 VITALS — BP 100/63 | HR 86 | Resp 16 | Ht 70.0 in | Wt 214.2 lb

## 2017-10-27 DIAGNOSIS — N4231 Prostatic intraepithelial neoplasia: Secondary | ICD-10-CM | POA: Diagnosis not present

## 2017-10-27 DIAGNOSIS — R972 Elevated prostate specific antigen [PSA]: Secondary | ICD-10-CM | POA: Diagnosis not present

## 2017-10-27 LAB — MICROSCOPIC EXAMINATION
Epithelial Cells (non renal): NONE SEEN /hpf (ref 0–10)
RBC, UA: NONE SEEN /hpf (ref 0–2)

## 2017-10-27 LAB — URINALYSIS, COMPLETE
BILIRUBIN UA: NEGATIVE
Glucose, UA: NEGATIVE
LEUKOCYTES UA: NEGATIVE
Nitrite, UA: NEGATIVE
RBC, UA: NEGATIVE
Specific Gravity, UA: >1.03 — ABNORMAL HIGH (ref 1.005–1.030)
Urobilinogen, Ur: 1 mg/dL (ref 0.2–1.0)
pH, UA: 5 (ref 5.0–7.5)

## 2017-10-28 ENCOUNTER — Telehealth: Payer: Self-pay

## 2017-10-28 LAB — PSA: Prostate Specific Ag, Serum: 10.8 ng/mL — ABNORMAL HIGH (ref 0.0–4.0)

## 2017-10-28 NOTE — Telephone Encounter (Signed)
-----   Message from [name], MD sent at 10/28/2017  7:23 AM EDT ----- PSA has increased and was 10.8.  Options include continued monitoring versus repeat prostate biopsy.  If he desires monitoring would recommend  PSA 4 to 6 months.

## 2017-10-28 NOTE — Telephone Encounter (Signed)
lmom for pt call office

## 2017-10-28 NOTE — Telephone Encounter (Signed)
Patient returned call.  PSA results and options were provided to the patient.  He would like to continue to monitor his PSA at this time.  We will schedule lab visit in 5 months.

## 2017-10-29 NOTE — Progress Notes (Signed)
10/27/2017 4:30 PM   Travis Moore May 03, 1930 [id number]  Referring provider: [name], MD [address] [place], [place] [address]  Chief Complaint  Patient presents with  . Follow-up   Urologic problem list: -Elevated PSA (prostate biopsy 1999 for PSA of 4.2 with findings of high-grade PIN.  Follow-up biopsy without evidence of cancer or PIN.  PSA increased to 6.6 in 2015 and he elected surveillance)  -BPH with mild lower urinary tract symptoms; no treatment   HPI: 84 year old male presents for annual follow-up.  I last saw him at [facility] in May 2018. PSA at that visit had increased to 8.02 and a follow-up PSA in September 2018 was 8.54.  Options of continued surveillance, prostate MRI or repeat biopsy were discussed and he elected continue surveillance based on his age.   PMH: Past Medical History:  Diagnosis Date  . Coronary artery disease   . Myocardial infarction ([place])   . Nephrolithiasis     Surgical History: Past Surgical History:  Procedure Laterality Date  . ABDOMINAL SURGERY    . CARDIAC SURGERY      Home Medications:  Allergies as of 10/27/2017   No Known Allergies     Medication List        Accurate as of 10/27/17 11:59 PM. Always use your most recent med list.          meclizine 25 MG tablet Commonly known as:  ANTIVERT Take 1 tablet (25 mg total) by mouth 3 (three) times daily as needed for dizziness.       Allergies: No Known Allergies  Family History: History reviewed. No pertinent family history.  Social History:  reports that he has never smoked. He has never used smokeless tobacco. He reports that he does not drink alcohol. His drug history is not on file.  ROS: UROLOGY Frequent Urination?: No Hard to postpone urination?: No Burning/pain with urination?: No Get up at night to urinate?: Yes Leakage of urine?: No Urine stream starts and stops?: No Trouble starting stream?: No Do you have to strain to urinate?: No Blood in  urine?: No Urinary tract infection?: No Sexually transmitted disease?: No Injury to kidneys or bladder?: No Painful intercourse?: No Weak stream?: No Erection problems?: No Penile pain?: No  Gastrointestinal Nausea?: No Vomiting?: No Indigestion/heartburn?: No Diarrhea?: No Constipation?: No  Constitutional Fever: No Night sweats?: No Weight loss?: No Fatigue?: No  Skin Skin rash/lesions?: No Itching?: No  Eyes Blurred vision?: No Double vision?: No  Ears/Nose/Throat Sore throat?: No Sinus problems?: No  Hematologic/Lymphatic Swollen glands?: No Easy bruising?: No  Cardiovascular Leg swelling?: No Chest pain?: No  Respiratory Cough?: No Shortness of breath?: No  Endocrine Excessive thirst?: No  Musculoskeletal Back pain?: No Joint pain?: No  Neurological Headaches?: No Dizziness?: No  Psychologic Depression?: No Anxiety?: No  Physical Exam: BP 100/63   Pulse 86   Resp 16   Ht 5\' 10"  (1.778 m)   Wt 214 lb 3.2 oz (97.2 kg)   SpO2 98%   BMI 30.73 kg/m    Constitutional:  Alert and oriented, No acute distress. HEENT: [place] AT, moist mucus membranes.  Trachea midline, no masses. Cardiovascular: No clubbing, cyanosis, or edema. Respiratory: Normal respiratory effort, no increased work of breathing. GI: Abdomen is soft, nontender, nondistended, no abdominal masses GU: No CVA tenderness.  Prostate 45 g, smooth without nodules Lymph: No cervical or inguinal lymphadenopathy. Skin: No rashes, bruises or suspicious lesions. Neurologic: Grossly intact, no focal deficits, moving all  4 extremities. Psychiatric: Normal mood and affect.   Assessment & Plan:   84 year old male with elevated PSA and high-grade PIN on biopsy in 1999.  His PSA has slowly increased over the last few years and he desires to continue surveillance.  PSA was repeated today and he will be notified with the results.   [name], [place] [address] [place], [place] [address] [phone]

## 2017-10-31 ENCOUNTER — Encounter: Payer: Self-pay | Admitting: Urology

## 2017-12-29 DIAGNOSIS — E785 Hyperlipidemia, unspecified: Secondary | ICD-10-CM | POA: Diagnosis not present

## 2017-12-29 DIAGNOSIS — I498 Other specified cardiac arrhythmias: Secondary | ICD-10-CM | POA: Diagnosis not present

## 2017-12-29 DIAGNOSIS — I2510 Atherosclerotic heart disease of native coronary artery without angina pectoris: Secondary | ICD-10-CM | POA: Diagnosis not present

## 2017-12-29 DIAGNOSIS — I10 Essential (primary) hypertension: Secondary | ICD-10-CM | POA: Diagnosis not present

## 2018-01-23 DIAGNOSIS — X32XXXA Exposure to sunlight, initial encounter: Secondary | ICD-10-CM | POA: Diagnosis not present

## 2018-01-23 DIAGNOSIS — D2272 Melanocytic nevi of left lower limb, including hip: Secondary | ICD-10-CM | POA: Diagnosis not present

## 2018-01-23 DIAGNOSIS — S80861A Insect bite (nonvenomous), right lower leg, initial encounter: Secondary | ICD-10-CM | POA: Diagnosis not present

## 2018-01-23 DIAGNOSIS — D2271 Melanocytic nevi of right lower limb, including hip: Secondary | ICD-10-CM | POA: Diagnosis not present

## 2018-01-23 DIAGNOSIS — S80862A Insect bite (nonvenomous), left lower leg, initial encounter: Secondary | ICD-10-CM | POA: Diagnosis not present

## 2018-01-23 DIAGNOSIS — L570 Actinic keratosis: Secondary | ICD-10-CM | POA: Diagnosis not present

## 2018-01-23 DIAGNOSIS — D485 Neoplasm of uncertain behavior of skin: Secondary | ICD-10-CM | POA: Diagnosis not present

## 2018-01-23 DIAGNOSIS — Z85828 Personal history of other malignant neoplasm of skin: Secondary | ICD-10-CM | POA: Diagnosis not present

## 2018-01-23 DIAGNOSIS — C44309 Unspecified malignant neoplasm of skin of other parts of face: Secondary | ICD-10-CM | POA: Diagnosis not present

## 2018-01-23 DIAGNOSIS — D225 Melanocytic nevi of trunk: Secondary | ICD-10-CM | POA: Diagnosis not present

## 2018-01-23 DIAGNOSIS — D2261 Melanocytic nevi of right upper limb, including shoulder: Secondary | ICD-10-CM | POA: Diagnosis not present

## 2018-01-23 DIAGNOSIS — Z08 Encounter for follow-up examination after completed treatment for malignant neoplasm: Secondary | ICD-10-CM | POA: Diagnosis not present

## 2018-01-23 DIAGNOSIS — D2262 Melanocytic nevi of left upper limb, including shoulder: Secondary | ICD-10-CM | POA: Diagnosis not present

## 2018-02-21 DIAGNOSIS — C44319 Basal cell carcinoma of skin of other parts of face: Secondary | ICD-10-CM | POA: Diagnosis not present

## 2018-03-23 DIAGNOSIS — I10 Essential (primary) hypertension: Secondary | ICD-10-CM | POA: Diagnosis not present

## 2018-03-23 DIAGNOSIS — I2510 Atherosclerotic heart disease of native coronary artery without angina pectoris: Secondary | ICD-10-CM | POA: Diagnosis not present

## 2018-03-23 DIAGNOSIS — I498 Other specified cardiac arrhythmias: Secondary | ICD-10-CM | POA: Diagnosis not present

## 2018-03-23 DIAGNOSIS — J399 Disease of upper respiratory tract, unspecified: Secondary | ICD-10-CM | POA: Diagnosis not present

## 2018-03-29 ENCOUNTER — Other Ambulatory Visit: Payer: Self-pay | Admitting: Family Medicine

## 2018-03-29 DIAGNOSIS — R972 Elevated prostate specific antigen [PSA]: Secondary | ICD-10-CM

## 2018-03-30 ENCOUNTER — Other Ambulatory Visit: Payer: Medicare Other

## 2018-03-30 DIAGNOSIS — R972 Elevated prostate specific antigen [PSA]: Secondary | ICD-10-CM | POA: Diagnosis not present

## 2018-03-31 LAB — PSA: PROSTATE SPECIFIC AG, SERUM: 9.6 ng/mL — AB (ref 0.0–4.0)

## 2018-04-02 ENCOUNTER — Other Ambulatory Visit: Payer: Self-pay | Admitting: Urology

## 2018-04-02 DIAGNOSIS — R972 Elevated prostate specific antigen [PSA]: Secondary | ICD-10-CM

## 2018-04-04 ENCOUNTER — Telehealth: Payer: Self-pay | Admitting: Family Medicine

## 2018-04-04 NOTE — Telephone Encounter (Signed)
-----   Message from [name], MD sent at 04/02/2018  9:47 AM EDT ----- PSA much better at 9.6.  If he desires to continue surveillance recommend a follow-up visit/PSA 6 months.

## 2018-04-04 NOTE — Telephone Encounter (Signed)
Patient notified and will call back to schedule the appointment.

## 2018-06-28 DIAGNOSIS — I498 Other specified cardiac arrhythmias: Secondary | ICD-10-CM | POA: Diagnosis not present

## 2018-06-28 DIAGNOSIS — I10 Essential (primary) hypertension: Secondary | ICD-10-CM | POA: Diagnosis not present

## 2018-06-28 DIAGNOSIS — I2510 Atherosclerotic heart disease of native coronary artery without angina pectoris: Secondary | ICD-10-CM | POA: Diagnosis not present

## 2018-06-28 DIAGNOSIS — M7550 Bursitis of unspecified shoulder: Secondary | ICD-10-CM | POA: Diagnosis not present

## 2018-07-27 DIAGNOSIS — Z85828 Personal history of other malignant neoplasm of skin: Secondary | ICD-10-CM | POA: Diagnosis not present

## 2018-07-27 DIAGNOSIS — X32XXXA Exposure to sunlight, initial encounter: Secondary | ICD-10-CM | POA: Diagnosis not present

## 2018-07-27 DIAGNOSIS — L821 Other seborrheic keratosis: Secondary | ICD-10-CM | POA: Diagnosis not present

## 2018-07-27 DIAGNOSIS — D225 Melanocytic nevi of trunk: Secondary | ICD-10-CM | POA: Diagnosis not present

## 2018-07-27 DIAGNOSIS — D485 Neoplasm of uncertain behavior of skin: Secondary | ICD-10-CM | POA: Diagnosis not present

## 2018-07-27 DIAGNOSIS — L298 Other pruritus: Secondary | ICD-10-CM | POA: Diagnosis not present

## 2018-07-27 DIAGNOSIS — D045 Carcinoma in situ of skin of trunk: Secondary | ICD-10-CM | POA: Diagnosis not present

## 2018-07-27 DIAGNOSIS — Z08 Encounter for follow-up examination after completed treatment for malignant neoplasm: Secondary | ICD-10-CM | POA: Diagnosis not present

## 2018-07-27 DIAGNOSIS — D2261 Melanocytic nevi of right upper limb, including shoulder: Secondary | ICD-10-CM | POA: Diagnosis not present

## 2018-07-27 DIAGNOSIS — D2262 Melanocytic nevi of left upper limb, including shoulder: Secondary | ICD-10-CM | POA: Diagnosis not present

## 2018-07-27 DIAGNOSIS — L570 Actinic keratosis: Secondary | ICD-10-CM | POA: Diagnosis not present

## 2018-08-17 DIAGNOSIS — D045 Carcinoma in situ of skin of trunk: Secondary | ICD-10-CM | POA: Diagnosis not present

## 2018-12-26 DIAGNOSIS — R0602 Shortness of breath: Secondary | ICD-10-CM | POA: Diagnosis not present

## 2019-01-05 DIAGNOSIS — R0609 Other forms of dyspnea: Secondary | ICD-10-CM | POA: Diagnosis not present

## 2019-01-05 DIAGNOSIS — Z6830 Body mass index (BMI) 30.0-30.9, adult: Secondary | ICD-10-CM | POA: Diagnosis not present

## 2019-01-19 ENCOUNTER — Other Ambulatory Visit: Payer: Self-pay

## 2019-02-02 DIAGNOSIS — R0602 Shortness of breath: Secondary | ICD-10-CM | POA: Diagnosis not present

## 2019-02-06 DIAGNOSIS — R0602 Shortness of breath: Secondary | ICD-10-CM | POA: Diagnosis not present

## 2019-05-01 DIAGNOSIS — X32XXXA Exposure to sunlight, initial encounter: Secondary | ICD-10-CM | POA: Diagnosis not present

## 2019-05-01 DIAGNOSIS — Z08 Encounter for follow-up examination after completed treatment for malignant neoplasm: Secondary | ICD-10-CM | POA: Diagnosis not present

## 2019-05-01 DIAGNOSIS — D485 Neoplasm of uncertain behavior of skin: Secondary | ICD-10-CM | POA: Diagnosis not present

## 2019-05-01 DIAGNOSIS — Z85828 Personal history of other malignant neoplasm of skin: Secondary | ICD-10-CM | POA: Diagnosis not present

## 2019-05-01 DIAGNOSIS — B353 Tinea pedis: Secondary | ICD-10-CM | POA: Diagnosis not present

## 2019-05-01 DIAGNOSIS — L820 Inflamed seborrheic keratosis: Secondary | ICD-10-CM | POA: Diagnosis not present

## 2019-05-01 DIAGNOSIS — L538 Other specified erythematous conditions: Secondary | ICD-10-CM | POA: Diagnosis not present

## 2019-05-01 DIAGNOSIS — L570 Actinic keratosis: Secondary | ICD-10-CM | POA: Diagnosis not present

## 2019-08-01 DIAGNOSIS — C44329 Squamous cell carcinoma of skin of other parts of face: Secondary | ICD-10-CM | POA: Diagnosis not present

## 2019-08-01 DIAGNOSIS — C44229 Squamous cell carcinoma of skin of left ear and external auricular canal: Secondary | ICD-10-CM | POA: Diagnosis not present

## 2019-08-01 DIAGNOSIS — D485 Neoplasm of uncertain behavior of skin: Secondary | ICD-10-CM | POA: Diagnosis not present

## 2019-08-01 DIAGNOSIS — Z85828 Personal history of other malignant neoplasm of skin: Secondary | ICD-10-CM | POA: Diagnosis not present

## 2019-08-01 DIAGNOSIS — D225 Melanocytic nevi of trunk: Secondary | ICD-10-CM | POA: Diagnosis not present

## 2019-08-01 DIAGNOSIS — L570 Actinic keratosis: Secondary | ICD-10-CM | POA: Diagnosis not present

## 2019-08-01 DIAGNOSIS — Z08 Encounter for follow-up examination after completed treatment for malignant neoplasm: Secondary | ICD-10-CM | POA: Diagnosis not present

## 2019-08-01 DIAGNOSIS — C44222 Squamous cell carcinoma of skin of right ear and external auricular canal: Secondary | ICD-10-CM | POA: Diagnosis not present

## 2019-08-01 DIAGNOSIS — X32XXXA Exposure to sunlight, initial encounter: Secondary | ICD-10-CM | POA: Diagnosis not present

## 2019-08-15 DIAGNOSIS — C44329 Squamous cell carcinoma of skin of other parts of face: Secondary | ICD-10-CM | POA: Diagnosis not present

## 2019-08-21 DIAGNOSIS — C44222 Squamous cell carcinoma of skin of right ear and external auricular canal: Secondary | ICD-10-CM | POA: Diagnosis not present

## 2019-08-21 DIAGNOSIS — C44229 Squamous cell carcinoma of skin of left ear and external auricular canal: Secondary | ICD-10-CM | POA: Diagnosis not present

## 2019-09-17 DIAGNOSIS — G609 Hereditary and idiopathic neuropathy, unspecified: Secondary | ICD-10-CM | POA: Diagnosis not present

## 2019-09-17 DIAGNOSIS — I7090 Unspecified atherosclerosis: Secondary | ICD-10-CM | POA: Diagnosis not present

## 2019-09-17 DIAGNOSIS — I498 Other specified cardiac arrhythmias: Secondary | ICD-10-CM | POA: Diagnosis not present

## 2019-09-17 DIAGNOSIS — R0600 Dyspnea, unspecified: Secondary | ICD-10-CM | POA: Diagnosis not present

## 2019-09-18 ENCOUNTER — Ambulatory Visit
Admission: RE | Admit: 2019-09-18 | Discharge: 2019-09-18 | Disposition: A | Discharge status: Home / Self Care | Payer: Medicare Other | Source: Ambulatory Visit | Attending: Internal Medicine | Admitting: Internal Medicine

## 2019-09-18 ENCOUNTER — Other Ambulatory Visit: Payer: Self-pay | Admitting: Internal Medicine

## 2019-09-18 ENCOUNTER — Other Ambulatory Visit: Payer: Self-pay

## 2019-09-18 DIAGNOSIS — I209 Angina pectoris, unspecified: Secondary | ICD-10-CM | POA: Diagnosis not present

## 2019-09-18 DIAGNOSIS — I498 Other specified cardiac arrhythmias: Secondary | ICD-10-CM | POA: Diagnosis not present

## 2019-09-18 DIAGNOSIS — R05 Cough: Secondary | ICD-10-CM | POA: Diagnosis not present

## 2019-09-18 DIAGNOSIS — R0602 Shortness of breath: Secondary | ICD-10-CM

## 2019-09-18 DIAGNOSIS — R5381 Other malaise: Secondary | ICD-10-CM | POA: Diagnosis not present

## 2019-09-18 DIAGNOSIS — I10 Essential (primary) hypertension: Secondary | ICD-10-CM | POA: Diagnosis not present

## 2019-09-18 DIAGNOSIS — I2510 Atherosclerotic heart disease of native coronary artery without angina pectoris: Secondary | ICD-10-CM | POA: Diagnosis not present

## 2019-09-18 DIAGNOSIS — Z125 Encounter for screening for malignant neoplasm of prostate: Secondary | ICD-10-CM | POA: Diagnosis not present

## 2019-09-19 DIAGNOSIS — R0602 Shortness of breath: Secondary | ICD-10-CM | POA: Diagnosis not present

## 2019-09-19 DIAGNOSIS — I2510 Atherosclerotic heart disease of native coronary artery without angina pectoris: Secondary | ICD-10-CM | POA: Diagnosis not present

## 2019-09-19 DIAGNOSIS — I209 Angina pectoris, unspecified: Secondary | ICD-10-CM | POA: Diagnosis not present

## 2019-09-19 DIAGNOSIS — I498 Other specified cardiac arrhythmias: Secondary | ICD-10-CM | POA: Diagnosis not present

## 2019-09-19 DIAGNOSIS — G609 Hereditary and idiopathic neuropathy, unspecified: Secondary | ICD-10-CM | POA: Diagnosis not present

## 2019-10-22 DIAGNOSIS — L905 Scar conditions and fibrosis of skin: Secondary | ICD-10-CM | POA: Diagnosis not present

## 2019-10-22 DIAGNOSIS — Z85828 Personal history of other malignant neoplasm of skin: Secondary | ICD-10-CM | POA: Diagnosis not present

## 2019-11-02 DIAGNOSIS — D225 Melanocytic nevi of trunk: Secondary | ICD-10-CM | POA: Diagnosis not present

## 2019-11-02 DIAGNOSIS — D2261 Melanocytic nevi of right upper limb, including shoulder: Secondary | ICD-10-CM | POA: Diagnosis not present

## 2019-11-02 DIAGNOSIS — D2271 Melanocytic nevi of right lower limb, including hip: Secondary | ICD-10-CM | POA: Diagnosis not present

## 2019-11-02 DIAGNOSIS — D485 Neoplasm of uncertain behavior of skin: Secondary | ICD-10-CM | POA: Diagnosis not present

## 2019-11-02 DIAGNOSIS — D2272 Melanocytic nevi of left lower limb, including hip: Secondary | ICD-10-CM | POA: Diagnosis not present

## 2019-11-02 DIAGNOSIS — D2262 Melanocytic nevi of left upper limb, including shoulder: Secondary | ICD-10-CM | POA: Diagnosis not present

## 2019-11-02 DIAGNOSIS — Z85828 Personal history of other malignant neoplasm of skin: Secondary | ICD-10-CM | POA: Diagnosis not present

## 2019-11-02 DIAGNOSIS — L570 Actinic keratosis: Secondary | ICD-10-CM | POA: Diagnosis not present

## 2019-12-05 ENCOUNTER — Ambulatory Visit (INDEPENDENT_AMBULATORY_CARE_PROVIDER_SITE_OTHER): Payer: Medicare Other | Admitting: Internal Medicine

## 2019-12-05 ENCOUNTER — Other Ambulatory Visit: Payer: Self-pay

## 2019-12-05 ENCOUNTER — Encounter: Payer: Self-pay | Admitting: Internal Medicine

## 2019-12-05 VITALS — BP 138/69 | HR 70 | Ht 70.0 in | Wt 213.6 lb

## 2019-12-05 DIAGNOSIS — R42 Dizziness and giddiness: Secondary | ICD-10-CM | POA: Insufficient documentation

## 2019-12-05 DIAGNOSIS — R0600 Dyspnea, unspecified: Secondary | ICD-10-CM | POA: Diagnosis not present

## 2019-12-05 DIAGNOSIS — I2510 Atherosclerotic heart disease of native coronary artery without angina pectoris: Secondary | ICD-10-CM

## 2019-12-05 DIAGNOSIS — Z0000 Encounter for general adult medical examination without abnormal findings: Secondary | ICD-10-CM | POA: Diagnosis not present

## 2019-12-05 DIAGNOSIS — R0609 Other forms of dyspnea: Secondary | ICD-10-CM | POA: Insufficient documentation

## 2019-12-05 NOTE — Progress Notes (Signed)
Established Patient Office Visit  SUBJECTIVE:  Patient ID: Travis Moore, male    DOB: 03-18-1930  Age: 86 y.o. MRN: [id number]  CC:  Chief Complaint  Patient presents with  . Medicare Annual Wellness    HPI Travis Moore presents for his medicare annual wellness check.  He notes that he becomes dizzy on occasion when moving from a sitting to a standing position. He denies syncope or palpetations. He has stents. He states that he does not drink as much water as he probably should. He also complains of shortness of breath on exertion.   Decrease Losartan to 1/2 pill per day.   He notes that it has been a while since his prostate was checked.   He has not seen an eye doctor in quite some time; his pervious physician passed away.   Past Medical History:  Diagnosis Date  . Coronary artery disease   . Myocardial infarction ([place])   . Nephrolithiasis     Past Surgical History:  Procedure Laterality Date  . ABDOMINAL SURGERY    . CARDIAC SURGERY      History reviewed. No pertinent family history.  Social History   Socioeconomic History  . Marital status: Married    Spouse name: Not on file  . Number of children: Not on file  . Years of education: Not on file  . Highest education level: Not on file  Occupational History  . Not on file  Tobacco Use  . Smoking status: Never Smoker  . Smokeless tobacco: Never Used  Substance and Sexual Activity  . Alcohol use: No  . Drug use: Not on file  . Sexual activity: Not on file  Other Topics Concern  . Not on file  Social History Narrative  . Not on file   Social Determinants of Health   Financial Resource Strain:   . Difficulty of Paying Living Expenses:   Food Insecurity:   . Worried About Charity fundraiser in the Last Year:   . Arboriculturist in the Last Year:   Transportation Needs:   . Film/video editor (Medical):   Marland Kitchen Lack of Transportation (Non-Medical):   Physical Activity:   . Days of  Exercise per Week:   . Minutes of Exercise per Session:   Stress:   . Feeling of Stress :   Social Connections:   . Frequency of Communication with Friends and Family:   . Frequency of Social Gatherings with Friends and Family:   . Attends Religious Services:   . Active Member of Clubs or Organizations:   . Attends Archivist Meetings:   Marland Kitchen Marital Status:   Intimate Partner Violence:   . Fear of Current or Ex-Partner:   . Emotionally Abused:   Marland Kitchen Physically Abused:   . Sexually Abused:      Current Outpatient Medications:  .  aspirin EC 81 MG tablet, Take 81 mg by mouth daily. Swallow whole., Disp: , Rfl:  .  atorvastatin (LIPITOR) 20 MG tablet, Take 20 mg by mouth daily., Disp: , Rfl:  .  clopidogrel (PLAVIX) 75 MG tablet, Take 75 mg by mouth daily., Disp: , Rfl:  .  losartan (COZAAR) 50 MG tablet, Take 50 mg by mouth daily., Disp: , Rfl:  .  metoprolol succinate (TOPROL-XL) 25 MG 24 hr tablet, Take 25 mg by mouth daily., Disp: , Rfl:    No Known Allergies  ROS Review of Systems  Constitutional: Negative.   HENT:  Negative.   Eyes: Negative.   Respiratory: Positive for shortness of breath (on exertion).   Cardiovascular: Negative.   Gastrointestinal: Negative.   Endocrine: Negative.   Genitourinary: Negative.  Negative for difficulty urinating, dysuria, frequency and urgency.  Musculoskeletal: Negative.   Skin: Negative.   Allergic/Immunologic: Negative.   Neurological: Positive for light-headedness. Negative for syncope.  Hematological: Negative.   Psychiatric/Behavioral: Negative.   All other systems reviewed and are negative.     OBJECTIVE:    Physical Exam Vitals reviewed.  Constitutional:      Appearance: Normal appearance.  HENT:     Mouth/Throat:     Mouth: Mucous membranes are moist.  Eyes:     Pupils: Pupils are equal, round, and reactive to light.  Neck:     Vascular: No carotid bruit.  Cardiovascular:     Rate and Rhythm: Normal rate  and regular rhythm.     Pulses: Normal pulses.     Heart sounds: Normal heart sounds.  Pulmonary:     Effort: Pulmonary effort is normal.     Breath sounds: Normal breath sounds.  Abdominal:     General: Bowel sounds are normal.     Palpations: Abdomen is soft. There is no hepatomegaly or splenomegaly.     Tenderness: There is no abdominal tenderness.     Hernia: No hernia is present.  Genitourinary:    Prostate: Normal. Not enlarged and not tender.     Rectum: Normal. No mass or tenderness.  Musculoskeletal:     Cervical back: Neck supple.     Right lower leg: No edema.     Left lower leg: No edema.  Lymphadenopathy:     Upper Body:     Right upper body: No supraclavicular adenopathy.     Left upper body: No supraclavicular adenopathy.  Skin:    Findings: No rash.  Neurological:     Mental Status: He is alert and oriented to person, place, and time.  Psychiatric:        Mood and Affect: Mood and affect normal. Mood is not anxious or depressed.        Behavior: Behavior normal.     BP 138/69   Pulse 70   Ht 5\' 10"  (1.778 m)   Wt 213 lb 9.6 oz (96.9 kg)   BMI 30.65 kg/m  Wt Readings from Last 3 Encounters:  12/05/19 213 lb 9.6 oz (96.9 kg)  10/27/17 214 lb 3.2 oz (97.2 kg)  06/13/16 213 lb (96.6 kg)    Health Maintenance Due  Topic Date Due  . COVID-19 Vaccine (1) Never done  . TETANUS/TDAP  Never done  . PNA vac Low Risk Adult (1 of 2 - PCV13) Never done    There are no preventive care reminders to display for this patient.  CBC Latest Ref Rng & Units 06/13/2016  WBC 3.8 - 10.6 K/uL 6.4  Hemoglobin 13.0 - 18.0 g/dL 16.7  Hematocrit 40 - 52 % 48.3  Platelets 150 - 440 K/uL 241   CMP Latest Ref Rng & Units 06/13/2016  Glucose 65 - 99 mg/dL 118(H)  BUN 6 - 20 mg/dL 12  Creatinine 0.61 - 1.24 mg/dL 0.92  Sodium 135 - 145 mmol/L 139  Potassium 3.5 - 5.1 mmol/L 4.6  Chloride 101 - 111 mmol/L 106  CO2 22 - 32 mmol/L 26  Calcium 8.9 - 10.3 mg/dL 9.5    No  results found for: TSH Lab Results  Component Value Date   ANIONGAP  7 06/13/2016   No results found for: CHOL, HDL, LDLCALC, CHOLHDL No results found for: TRIG No results found for: HGBA1C    ASSESSMENT & PLAN:   Problem List Items Addressed This Visit      Cardiovascular and Mediastinum   Coronary artery disease involving native coronary artery of native heart without angina pectoris    Does not have any angina      Relevant Medications   atorvastatin (LIPITOR) 20 MG tablet   metoprolol succinate (TOPROL-XL) 25 MG 24 hr tablet   losartan (COZAAR) 50 MG tablet   aspirin EC 81 MG tablet     Other   Annual physical exam   Dyspnea on exertion    Chest is clear heart is regular no rhonchi were noted.  Stress test several months ago.  Was unremarkable.      Dizziness and giddiness - Primary    Postural hypotension  not detected         No orders of the defined types were placed in this encounter.  1. Dizziness and giddiness Does not have any evidence of postural hypotension.  His losartan was decreased to 25 mg p.o. daily  2. Coronary artery disease involving native coronary artery of native heart without angina pectoris Disease is stable without any angina  3. Dyspnea on exertion Breath on exertion which is multifactorial we will with all abnormal presurgical thickness he had a stress test performed several months ago which did not show any ischemia.  4. Annual physical exam Physical exam is normal for his age. Follow-up: Return in about 3 months (around 03/06/2020).    Dr. Jane Canary [facility] [address] [place], [place] [address]   By signing my name below, I, [employer], attest that this documentation has been prepared under the direction and in the presence of [name], MD. Electronically Signed: [name], MD 12/05/19, 9:50 AM    I personally performed the services described in this documentation, which was SCRIBED in my  presence. The recorded information has been reviewed and considered accurate. It has been edited as necessary during review. [name], MD

## 2019-12-05 NOTE — Assessment & Plan Note (Signed)
Does not have any angina

## 2019-12-05 NOTE — Assessment & Plan Note (Signed)
Chest is clear heart is regular no rhonchi were noted.  Stress test several months ago.  Was unremarkable.

## 2019-12-05 NOTE — Assessment & Plan Note (Signed)
Postural hypotension  not detected

## 2020-01-03 ENCOUNTER — Other Ambulatory Visit: Payer: Self-pay | Admitting: *Deleted

## 2020-01-03 MED ORDER — LOSARTAN POTASSIUM 50 MG PO TABS: 25.0000 mg | ORAL_TABLET | Freq: Every day | ORAL | 3 refills | Status: DC

## 2020-03-06 ENCOUNTER — Other Ambulatory Visit: Payer: Self-pay

## 2020-03-06 ENCOUNTER — Ambulatory Visit (INDEPENDENT_AMBULATORY_CARE_PROVIDER_SITE_OTHER): Payer: Medicare Other | Admitting: Internal Medicine

## 2020-03-06 ENCOUNTER — Encounter: Payer: Self-pay | Admitting: Internal Medicine

## 2020-03-06 VITALS — BP 152/80 | HR 85 | Ht 70.0 in | Wt 216.1 lb

## 2020-03-06 DIAGNOSIS — R0600 Dyspnea, unspecified: Secondary | ICD-10-CM

## 2020-03-06 DIAGNOSIS — E785 Hyperlipidemia, unspecified: Secondary | ICD-10-CM | POA: Diagnosis not present

## 2020-03-06 DIAGNOSIS — I10 Essential (primary) hypertension: Secondary | ICD-10-CM | POA: Diagnosis not present

## 2020-03-06 DIAGNOSIS — R0609 Other forms of dyspnea: Secondary | ICD-10-CM

## 2020-03-06 DIAGNOSIS — I2510 Atherosclerotic heart disease of native coronary artery without angina pectoris: Secondary | ICD-10-CM | POA: Diagnosis not present

## 2020-03-06 DIAGNOSIS — R42 Dizziness and giddiness: Secondary | ICD-10-CM

## 2020-03-06 MED ORDER — LOSARTAN POTASSIUM-HCTZ 50-12.5 MG PO TABS: 1.0000 | ORAL_TABLET | Freq: Every day | ORAL | 3 refills | Status: DC

## 2020-03-06 NOTE — Assessment & Plan Note (Signed)
No wheezing or sobat rest  , Bp  Med is changed

## 2020-03-06 NOTE — Progress Notes (Signed)
Established Patient Office Visit  SUBJECTIVE:  Subjective  Patient ID: Travis Moore, male    DOB: 1930-06-07  Age: 86 y.o. MRN: [id number]  CC:  Chief Complaint  Patient presents with  . Hypertension    HPI MEGAN HAYDUK is a 86 y.o. male presenting today for a three month hypertension check.  His blood pressure today is 152/80. He has been taking his medication as directed and without any complications. He declines any missed doses.   He notes that he has continued shortness of breath. He is not able to walk very far and he gets short of breath easy when trying to work around the house.   He is vaccinated against [place].    Past Medical History:  Diagnosis Date  . Coronary artery disease   . Myocardial infarction ([place])   . Nephrolithiasis     Past Surgical History:  Procedure Laterality Date  . ABDOMINAL SURGERY    . CARDIAC SURGERY      History reviewed. No pertinent family history.  Social History   Socioeconomic History  . Marital status: Married    Spouse name: Not on file  . Number of children: Not on file  . Years of education: Not on file  . Highest education level: Not on file  Occupational History  . Not on file  Tobacco Use  . Smoking status: Never Smoker  . Smokeless tobacco: Never Used  Substance and Sexual Activity  . Alcohol use: No  . Drug use: Not on file  . Sexual activity: Not on file  Other Topics Concern  . Not on file  Social History Narrative  . Not on file   Social Determinants of Health   Financial Resource Strain:   . Difficulty of Paying Living Expenses: Not on file  Food Insecurity:   . Worried About Charity fundraiser in the Last Year: Not on file  . Ran Out of Food in the Last Year: Not on file  Transportation Needs:   . Lack of Transportation (Medical): Not on file  . Lack of Transportation (Non-Medical): Not on file  Physical Activity:   . Days of Exercise per Week: Not on file  . Minutes of  Exercise per Session: Not on file  Stress:   . Feeling of Stress : Not on file  Social Connections:   . Frequency of Communication with Friends and Family: Not on file  . Frequency of Social Gatherings with Friends and Family: Not on file  . Attends Religious Services: Not on file  . Active Member of Clubs or Organizations: Not on file  . Attends Archivist Meetings: Not on file  . Marital Status: Not on file  Intimate Partner Violence:   . Fear of Current or Ex-Partner: Not on file  . Emotionally Abused: Not on file  . Physically Abused: Not on file  . Sexually Abused: Not on file     Current Outpatient Medications:  .  aspirin EC 81 MG tablet, Take 81 mg by mouth daily. Swallow whole., Disp: , Rfl:  .  atorvastatin (LIPITOR) 20 MG tablet, Take 20 mg by mouth daily., Disp: , Rfl:  .  clopidogrel (PLAVIX) 75 MG tablet, Take 75 mg by mouth daily., Disp: , Rfl:  .  losartan (COZAAR) 50 MG tablet, Take 0.5 tablets (25 mg total) by mouth daily., Disp: 90 tablet, Rfl: 3 .  metoprolol succinate (TOPROL-XL) 25 MG 24 hr tablet, Take 25 mg by mouth daily.,  Disp: , Rfl:  .  losartan-hydrochlorothiazide (HYZAAR) 50-12.5 MG tablet, Take 1 tablet by mouth daily., Disp: 90 tablet, Rfl: 3   No Known Allergies  ROS Review of Systems  Constitutional: Negative.   HENT: Positive for congestion and postnasal drip.   Eyes: Negative.   Respiratory: Positive for cough.   Cardiovascular: Negative.   Gastrointestinal: Negative.  Negative for abdominal pain.  Endocrine: Negative.   Genitourinary: Negative.   Musculoskeletal: Negative.   Skin: Negative.   Allergic/Immunologic: Negative.   Neurological: Negative.  Negative for headaches.  Hematological: Negative.   Psychiatric/Behavioral: Negative.   All other systems reviewed and are negative.    OBJECTIVE:    Physical Exam Vitals reviewed.  Constitutional:      Appearance: Normal appearance.  HENT:     Mouth/Throat:     Mouth:  Mucous membranes are moist.  Eyes:     Pupils: Pupils are equal, round, and reactive to light.  Neck:     Vascular: No carotid bruit.  Cardiovascular:     Rate and Rhythm: Normal rate and regular rhythm.     Pulses:          Dorsalis pedis pulses are 1+ on the right side and 1+ on the left side.     Heart sounds: Normal heart sounds.  Pulmonary:     Effort: Pulmonary effort is normal.     Breath sounds: Normal breath sounds.  Abdominal:     General: Bowel sounds are normal.     Palpations: Abdomen is soft. There is no hepatomegaly, splenomegaly or mass.     Tenderness: There is no abdominal tenderness.     Hernia: No hernia is present.  Musculoskeletal:     Cervical back: Neck supple.     Right lower leg: 1+ Edema present.     Left lower leg: 1+ Edema present.  Skin:    Findings: No rash.  Neurological:     Mental Status: He is alert and oriented to person, place, and time.     Motor: No weakness.  Psychiatric:        Mood and Affect: Mood normal.        Behavior: Behavior normal.     BP (!) 152/80   Pulse 85   Ht 5\' 10"  (1.778 m)   Wt 216 lb 1.6 oz (98 kg)   BMI 31.01 kg/m  Wt Readings from Last 3 Encounters:  03/06/20 216 lb 1.6 oz (98 kg)  12/05/19 213 lb 9.6 oz (96.9 kg)  10/27/17 214 lb 3.2 oz (97.2 kg)    Health Maintenance Due  Topic Date Due  . COVID-19 Vaccine (1) Never done  . TETANUS/TDAP  Never done  . PNA vac Low Risk Adult (1 of 2 - PCV13) Never done  . INFLUENZA VACCINE  Never done    There are no preventive care reminders to display for this patient.  CBC Latest Ref Rng & Units 06/13/2016  WBC 3.8 - 10.6 K/uL 6.4  Hemoglobin 13.0 - 18.0 g/dL 16.7  Hematocrit 40 - 52 % 48.3  Platelets 150 - 440 K/uL 241   CMP Latest Ref Rng & Units 06/13/2016  Glucose 65 - 99 mg/dL 118(H)  BUN 6 - 20 mg/dL 12  Creatinine 0.61 - 1.24 mg/dL 0.92  Sodium 135 - 145 mmol/L 139  Potassium 3.5 - 5.1 mmol/L 4.6  Chloride 101 - 111 mmol/L 106  CO2 22 - 32  mmol/L 26  Calcium 8.9 - 10.3 mg/dL 9.5  No results found for: TSH Lab Results  Component Value Date   ANIONGAP 7 06/13/2016   No results found for: CHOL, HDL, LDLCALC, CHOLHDL No results found for: TRIG No results found for: HGBA1C    ASSESSMENT & PLAN:   Problem List Items Addressed This Visit      Cardiovascular and Mediastinum   Coronary artery disease involving native coronary artery of native heart without angina pectoris    stable      Relevant Medications   losartan-hydrochlorothiazide (HYZAAR) 50-12.5 MG tablet   Essential hypertension    - Today, the patient's blood pressure is not well managed on to losartan  hctz 12.5 po daily. - The patient will change the current treatment regimen.  - I encouraged the patient to eat a low-sodium diet to help control blood pressure. - I encouraged the patient to live an active lifestyle and complete activities that increases heart rate to 85% target heart rate at least 5 times per week for one hour.          Relevant Medications   losartan-hydrochlorothiazide (HYZAAR) 50-12.5 MG tablet     Other   Dyspnea on exertion - Primary    No wheezing or sobat rest  , Bp  Med is changed      Dizziness and giddiness    resolved      Dyslipidemia    - The patient's hyperlipidemia is stable on statin. - The patient will continue the current treatment regimen.  - I encouraged the patient to eat more vegetables and whole wheat, and to avoid fatty foods like whole milk, hard cheese, egg yolks, margarine, baked sweets, and fried foods.  - I encouraged the patient to live an active lifestyle and complete activities for 40 minutes at least three times per week.  - I instructed the patient to go to the ER if they begin having chest pain.          Meds ordered this encounter  Medications  . losartan-hydrochlorothiazide (HYZAAR) 50-12.5 MG tablet    Sig: Take 1 tablet by mouth daily.    Dispense:  90 tablet    Refill:  3     Follow-up: No follow-ups on file.    [name], MD [facility] [address], [place], [place] [address]   By signing my name below, I, [employer], attest that this documentation has been prepared under the direction and in the presence of Dr. [name] Electronically Signed: [name], MD 03/06/20, 9:45 AM  I personally performed the services described in this documentation, which was SCRIBED in my presence. The recorded information has been reviewed and considered accurate. It has been edited as necessary during review. [name], MD

## 2020-03-06 NOTE — Assessment & Plan Note (Signed)
resolved 

## 2020-03-06 NOTE — Assessment & Plan Note (Signed)

## 2020-03-06 NOTE — Assessment & Plan Note (Signed)
stable °

## 2020-03-06 NOTE — Assessment & Plan Note (Signed)
-   Today, the patient's blood pressure is not well managed on to losartan  hctz 12.5 po daily. - The patient will change the current treatment regimen.  - I encouraged the patient to eat a low-sodium diet to help control blood pressure. - I encouraged the patient to live an active lifestyle and complete activities that increases heart rate to 85% target heart rate at least 5 times per week for one hour.

## 2020-03-21 DIAGNOSIS — Z6831 Body mass index (BMI) 31.0-31.9, adult: Secondary | ICD-10-CM | POA: Diagnosis not present

## 2020-03-21 DIAGNOSIS — Z23 Encounter for immunization: Secondary | ICD-10-CM | POA: Diagnosis not present

## 2020-03-21 DIAGNOSIS — R059 Cough, unspecified: Secondary | ICD-10-CM | POA: Diagnosis not present

## 2020-03-21 DIAGNOSIS — R0600 Dyspnea, unspecified: Secondary | ICD-10-CM | POA: Diagnosis not present

## 2020-03-26 ENCOUNTER — Other Ambulatory Visit: Payer: Medicare Other

## 2020-03-27 ENCOUNTER — Other Ambulatory Visit: Payer: Self-pay

## 2020-03-27 ENCOUNTER — Telehealth: Payer: Self-pay | Admitting: Urology

## 2020-03-27 NOTE — Telephone Encounter (Signed)
S/W Caryl Pina at Dr. Jennette Kettle office and she will fax over pt's PSA results from 3/21.

## 2020-03-28 ENCOUNTER — Other Ambulatory Visit: Payer: Self-pay

## 2020-03-28 ENCOUNTER — Other Ambulatory Visit: Payer: Medicare Other

## 2020-03-28 DIAGNOSIS — R972 Elevated prostate specific antigen [PSA]: Secondary | ICD-10-CM | POA: Diagnosis not present

## 2020-03-29 LAB — PSA: Prostate Specific Ag, Serum: 13.3 ng/mL — ABNORMAL HIGH (ref 0.0–4.0)

## 2020-04-02 ENCOUNTER — Ambulatory Visit (INDEPENDENT_AMBULATORY_CARE_PROVIDER_SITE_OTHER): Payer: Medicare Other | Admitting: Urology

## 2020-04-02 ENCOUNTER — Other Ambulatory Visit: Payer: Self-pay

## 2020-04-02 VITALS — BP 121/69 | HR 73 | Ht 70.0 in | Wt 216.4 lb

## 2020-04-02 DIAGNOSIS — N402 Nodular prostate without lower urinary tract symptoms: Secondary | ICD-10-CM

## 2020-04-02 DIAGNOSIS — R972 Elevated prostate specific antigen [PSA]: Secondary | ICD-10-CM

## 2020-04-02 LAB — URINALYSIS, COMPLETE
Bilirubin, UA: NEGATIVE
Glucose, UA: NEGATIVE
Ketones, UA: NEGATIVE
Leukocytes,UA: NEGATIVE
Nitrite, UA: NEGATIVE
Protein,UA: NEGATIVE
RBC, UA: NEGATIVE
Specific Gravity, UA: 1.025 (ref 1.005–1.030)
Urobilinogen, Ur: 1 mg/dL (ref 0.2–1.0)
pH, UA: 6 (ref 5.0–7.5)

## 2020-04-02 LAB — MICROSCOPIC EXAMINATION
Bacteria, UA: NONE SEEN
Epithelial Cells (non renal): NONE SEEN /hpf (ref 0–10)

## 2020-04-03 ENCOUNTER — Encounter: Payer: Self-pay | Admitting: Urology

## 2020-04-03 DIAGNOSIS — H2513 Age-related nuclear cataract, bilateral: Secondary | ICD-10-CM | POA: Diagnosis not present

## 2020-04-03 NOTE — Progress Notes (Signed)
04/02/2020 7:30 AM   Travis Moore Apr 09, 1930 [id number]  Referring provider: [name], MD [address] [place],  [place] [address]  Chief Complaint  Patient presents with  . Follow-up    Urologic history: -Elevated PSA (prostate biopsy 1999 for PSA of 4.2 with findings of high-grade PIN.  Follow-up biopsy without evidence of cancer or PIN.  PSA increased to 6.6 in 2015 and he elected surveillance)  -BPH with mild lower urinary tract symptoms; no treatment   HPI: 86 y.o. male presents for follow-up of an elevated PSA.   Last visit was 10/27/2017; canceled follow-up last year due to Covid  No bothersome LUTS  Denies dysuria, gross hematuria  Denies flank, abdominal, pelvic pain  PSA 03/28/2020 increased 13.3      PMH: Past Medical History:  Diagnosis Date  . Coronary artery disease   . Myocardial infarction ([place])   . Nephrolithiasis     Surgical History: Past Surgical History:  Procedure Laterality Date  . ABDOMINAL SURGERY    . CARDIAC SURGERY      Home Medications:  Allergies as of 04/02/2020   No Known Allergies     Medication List       Accurate as of April 02, 2020 11:59 PM. If you have any questions, ask your nurse or doctor.        aspirin EC 81 MG tablet Take 81 mg by mouth daily. Swallow whole.   atorvastatin 20 MG tablet Commonly known as: LIPITOR Take 20 mg by mouth daily.   clopidogrel 75 MG tablet Commonly known as: PLAVIX Take 75 mg by mouth daily.   losartan 50 MG tablet Commonly known as: COZAAR Take 0.5 tablets (25 mg total) by mouth daily.   losartan-hydrochlorothiazide 50-12.5 MG tablet Commonly known as: HYZAAR Take 1 tablet by mouth daily.   metoprolol succinate 25 MG 24 hr tablet Commonly known as: TOPROL-XL Take 25 mg by mouth daily.       Allergies: No Known Allergies  Family History: No family history on file.  Social History:  reports that he has never smoked. He has never used smokeless  tobacco. He reports that he does not drink alcohol. No history on file for drug use.   Physical Exam: BP 121/69 (BP Location: Left Arm, Patient Position: Sitting, Cuff Size: Normal)   Pulse 73   Ht 5\' 10"  (1.778 m)   Wt 216 lb 6.4 oz (98.2 kg)   BMI 31.05 kg/m   Constitutional:  Alert and oriented, No acute distress. HEENT: [place] AT, moist mucus membranes.  Trachea midline, no masses. Cardiovascular: No clubbing, cyanosis, or edema. Respiratory: Normal respiratory effort, no increased work of breathing. GI: Abdomen is soft, nontender, nondistended, no abdominal masses GU: Prostate 40 g with increased firmness left not previously noted Skin: No rashes, bruises or suspicious lesions. Neurologic: Grossly intact, no focal deficits, moving all 4 extremities. Psychiatric: Normal mood and affect.   Assessment & Plan:    1. Elevated PSA  Rising PSA with abnormal DRE  We did discuss approximately 50% of men in 80s will have low risk prostate cancer however with a rising PSA and abnormal DRE the possibility of high risk prostate cancer was discussed  Urinalysis was unremarkable  Prostate biopsy was discussed to evaluate for the possibility of high risk prostate cancer and he would like to think over before scheduling    [name], MD  [place] [address], [place], [place] [address] [phone]  [phone]  

## 2020-04-16 ENCOUNTER — Ambulatory Visit: Payer: Medicare Other | Admitting: Internal Medicine

## 2020-04-22 DIAGNOSIS — D2271 Melanocytic nevi of right lower limb, including hip: Secondary | ICD-10-CM | POA: Diagnosis not present

## 2020-04-22 DIAGNOSIS — D225 Melanocytic nevi of trunk: Secondary | ICD-10-CM | POA: Diagnosis not present

## 2020-04-22 DIAGNOSIS — Z1283 Encounter for screening for malignant neoplasm of skin: Secondary | ICD-10-CM | POA: Diagnosis not present

## 2020-04-22 DIAGNOSIS — D2262 Melanocytic nevi of left upper limb, including shoulder: Secondary | ICD-10-CM | POA: Diagnosis not present

## 2020-04-22 DIAGNOSIS — D485 Neoplasm of uncertain behavior of skin: Secondary | ICD-10-CM | POA: Diagnosis not present

## 2020-04-22 DIAGNOSIS — D044 Carcinoma in situ of skin of scalp and neck: Secondary | ICD-10-CM | POA: Diagnosis not present

## 2020-04-22 DIAGNOSIS — D2261 Melanocytic nevi of right upper limb, including shoulder: Secondary | ICD-10-CM | POA: Diagnosis not present

## 2020-04-22 DIAGNOSIS — L570 Actinic keratosis: Secondary | ICD-10-CM | POA: Diagnosis not present

## 2020-04-22 DIAGNOSIS — Z85828 Personal history of other malignant neoplasm of skin: Secondary | ICD-10-CM | POA: Diagnosis not present

## 2020-05-14 DIAGNOSIS — R911 Solitary pulmonary nodule: Secondary | ICD-10-CM | POA: Diagnosis not present

## 2020-05-14 DIAGNOSIS — R059 Cough, unspecified: Secondary | ICD-10-CM | POA: Diagnosis not present

## 2020-05-14 DIAGNOSIS — R0600 Dyspnea, unspecified: Secondary | ICD-10-CM | POA: Diagnosis not present

## 2020-05-29 DIAGNOSIS — D044 Carcinoma in situ of skin of scalp and neck: Secondary | ICD-10-CM | POA: Diagnosis not present

## 2020-06-25 IMAGING — CR DG CHEST 2V
1 series · 3 of 3 positions shown · non-contrast
Comparison: 03/16/2017

CLINICAL DATA: Shortness of breath, dry cough

EXAM:
CHEST - 2 VIEW

[Series 1: dg chest 2 view · 0.14mm/px · 3 of 3 slices shown]
[im 1/3]
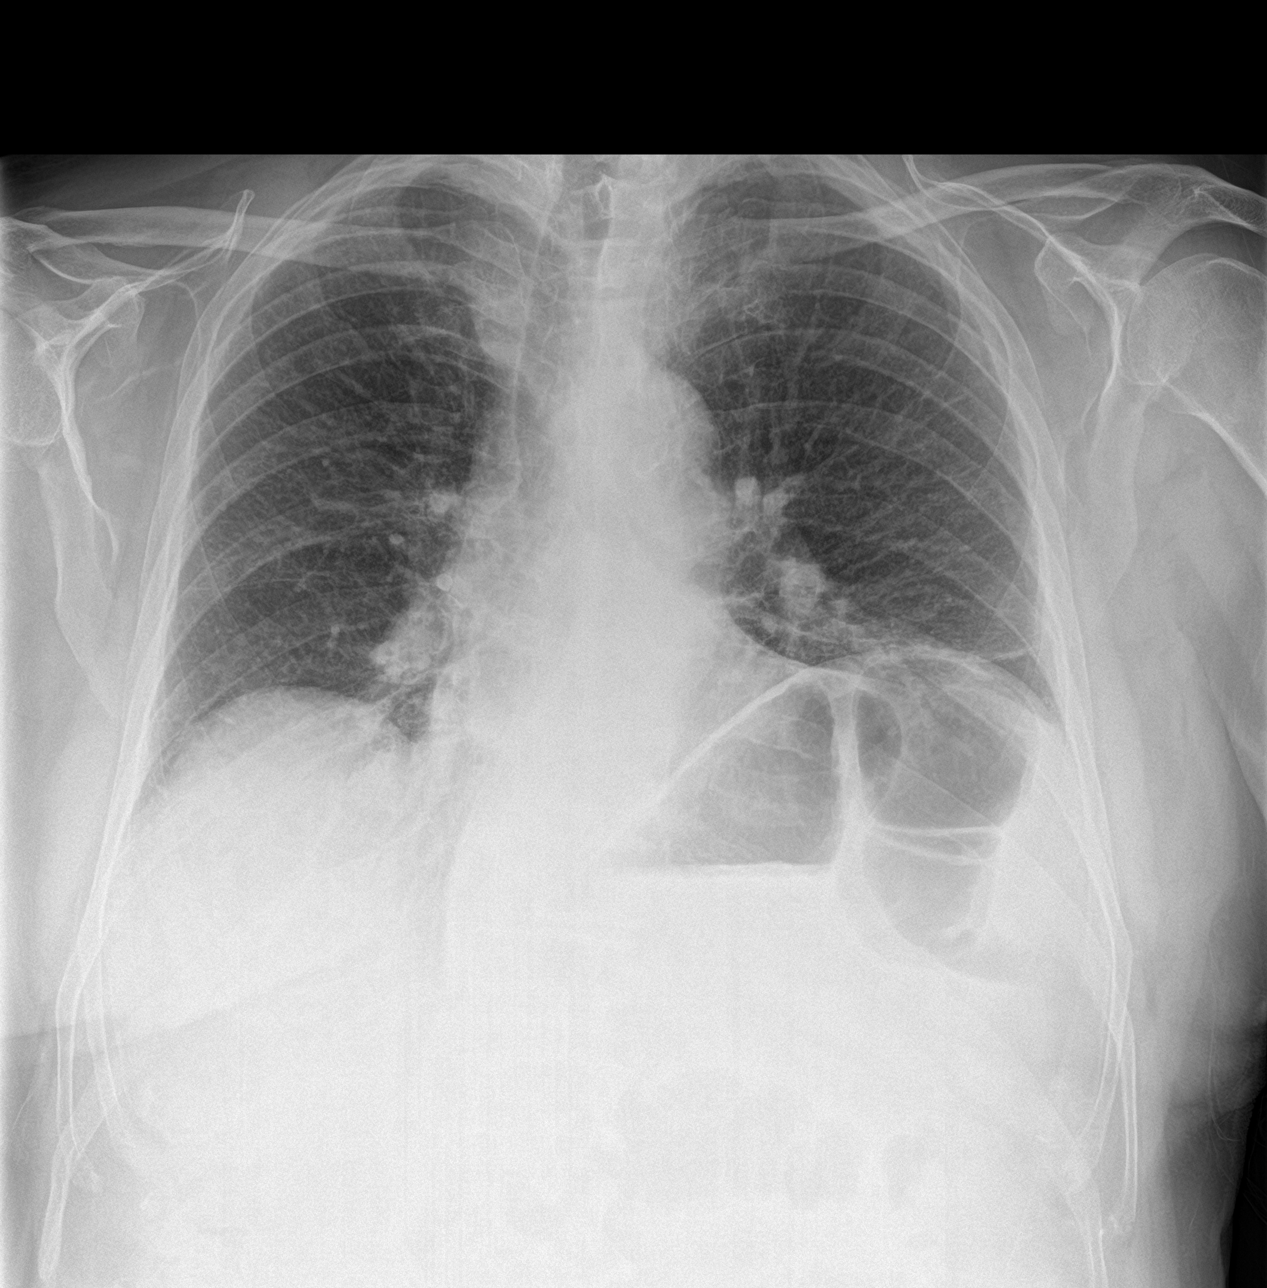
[im 2/3]
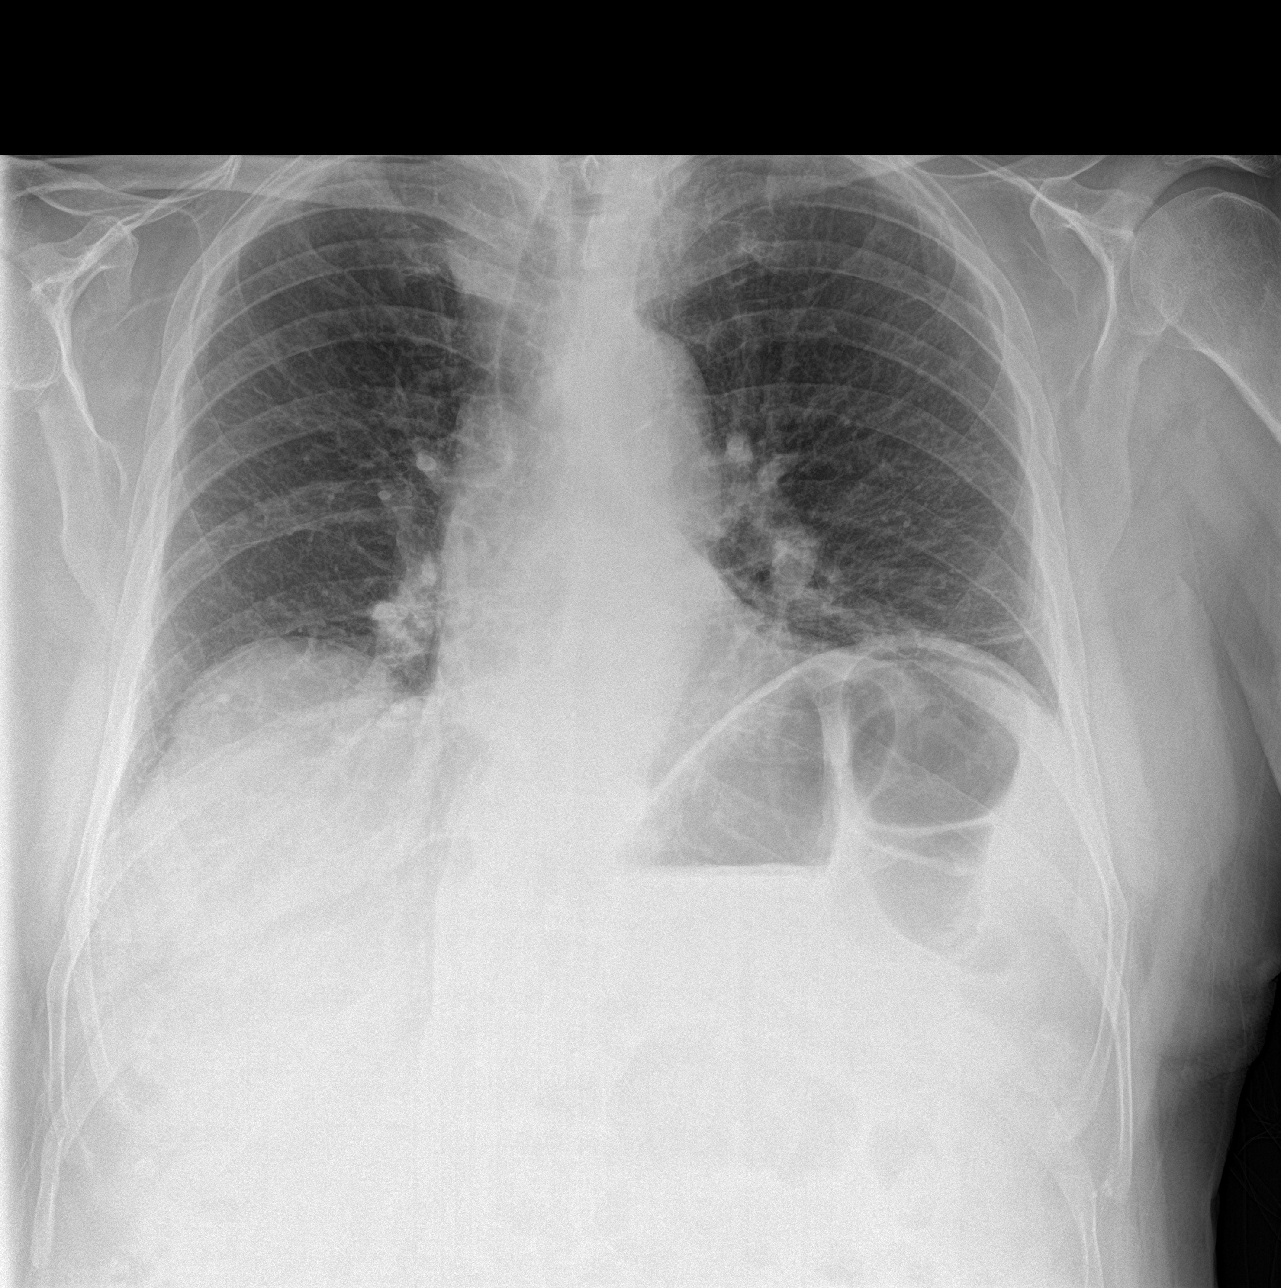
[im 3/3]
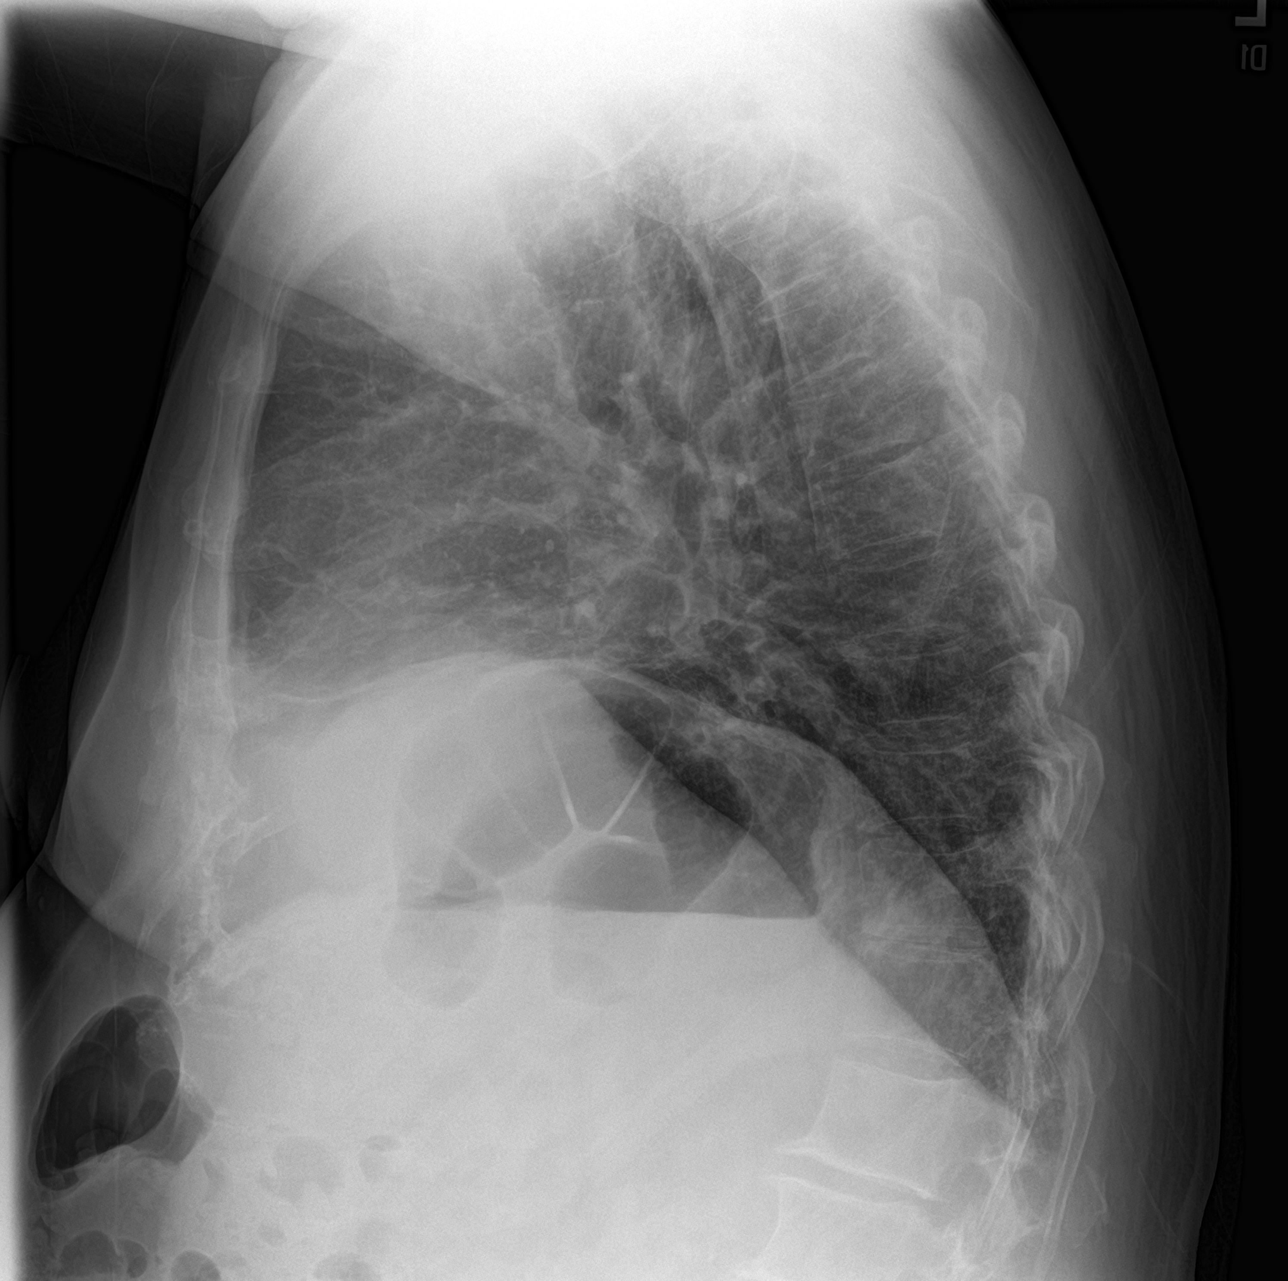

[3 of 3 positions shown; findings below may reference images not displayed]

FINDINGS: The heart size and mediastinal contours are within normal limits.
Unchanged elevation of the left hemidiaphragm with associated
scarring or atelectasis. The visualized skeletal structures are
unremarkable.
IMPRESSION: Unchanged elevation of the left hemidiaphragm with associated
scarring or atelectasis. No acute abnormality of the lungs.

## 2020-09-19 ENCOUNTER — Other Ambulatory Visit: Payer: Self-pay | Admitting: *Deleted

## 2020-09-19 MED ORDER — METOPROLOL SUCCINATE ER 25 MG PO TB24: 25.0000 mg | ORAL_TABLET | Freq: Every day | ORAL | 3 refills | Status: DC

## 2020-10-02 ENCOUNTER — Other Ambulatory Visit: Payer: Self-pay | Admitting: *Deleted

## 2020-10-02 MED ORDER — ATORVASTATIN CALCIUM 20 MG PO TABS: 20.0000 mg | ORAL_TABLET | Freq: Every day | ORAL | 3 refills | Status: DC

## 2020-10-02 MED ORDER — CLOPIDOGREL BISULFATE 75 MG PO TABS: 75.0000 mg | ORAL_TABLET | Freq: Every day | ORAL | 3 refills | Status: DC

## 2020-10-20 DIAGNOSIS — X32XXXA Exposure to sunlight, initial encounter: Secondary | ICD-10-CM | POA: Diagnosis not present

## 2020-10-20 DIAGNOSIS — Z85828 Personal history of other malignant neoplasm of skin: Secondary | ICD-10-CM | POA: Diagnosis not present

## 2020-10-20 DIAGNOSIS — D2261 Melanocytic nevi of right upper limb, including shoulder: Secondary | ICD-10-CM | POA: Diagnosis not present

## 2020-10-20 DIAGNOSIS — D2271 Melanocytic nevi of right lower limb, including hip: Secondary | ICD-10-CM | POA: Diagnosis not present

## 2020-10-20 DIAGNOSIS — L570 Actinic keratosis: Secondary | ICD-10-CM | POA: Diagnosis not present

## 2020-10-20 DIAGNOSIS — D0439 Carcinoma in situ of skin of other parts of face: Secondary | ICD-10-CM | POA: Diagnosis not present

## 2020-10-20 DIAGNOSIS — C44222 Squamous cell carcinoma of skin of right ear and external auricular canal: Secondary | ICD-10-CM | POA: Diagnosis not present

## 2020-10-20 DIAGNOSIS — D485 Neoplasm of uncertain behavior of skin: Secondary | ICD-10-CM | POA: Diagnosis not present

## 2020-10-20 DIAGNOSIS — D2262 Melanocytic nevi of left upper limb, including shoulder: Secondary | ICD-10-CM | POA: Diagnosis not present

## 2020-11-27 DIAGNOSIS — D0439 Carcinoma in situ of skin of other parts of face: Secondary | ICD-10-CM | POA: Diagnosis not present

## 2020-11-27 DIAGNOSIS — D0421 Carcinoma in situ of skin of right ear and external auricular canal: Secondary | ICD-10-CM | POA: Diagnosis not present

## 2021-02-24 DIAGNOSIS — Z87442 Personal history of urinary calculi: Secondary | ICD-10-CM | POA: Diagnosis not present

## 2021-02-24 DIAGNOSIS — N200 Calculus of kidney: Secondary | ICD-10-CM | POA: Diagnosis not present

## 2021-02-24 DIAGNOSIS — Z7902 Long term (current) use of antithrombotics/antiplatelets: Secondary | ICD-10-CM | POA: Diagnosis not present

## 2021-02-24 DIAGNOSIS — Z6830 Body mass index (BMI) 30.0-30.9, adult: Secondary | ICD-10-CM | POA: Diagnosis not present

## 2021-02-24 DIAGNOSIS — N132 Hydronephrosis with renal and ureteral calculous obstruction: Secondary | ICD-10-CM | POA: Diagnosis not present

## 2021-02-24 DIAGNOSIS — E785 Hyperlipidemia, unspecified: Secondary | ICD-10-CM | POA: Diagnosis not present

## 2021-02-24 DIAGNOSIS — R319 Hematuria, unspecified: Secondary | ICD-10-CM | POA: Diagnosis not present

## 2021-02-24 DIAGNOSIS — R109 Unspecified abdominal pain: Secondary | ICD-10-CM | POA: Diagnosis not present

## 2021-02-24 DIAGNOSIS — Z7982 Long term (current) use of aspirin: Secondary | ICD-10-CM | POA: Diagnosis not present

## 2021-02-24 DIAGNOSIS — I10 Essential (primary) hypertension: Secondary | ICD-10-CM | POA: Diagnosis not present

## 2021-02-24 DIAGNOSIS — Z7951 Long term (current) use of inhaled steroids: Secondary | ICD-10-CM | POA: Diagnosis not present

## 2021-02-24 DIAGNOSIS — Z79899 Other long term (current) drug therapy: Secondary | ICD-10-CM | POA: Diagnosis not present

## 2021-02-25 DIAGNOSIS — R319 Hematuria, unspecified: Secondary | ICD-10-CM | POA: Diagnosis not present

## 2021-02-25 DIAGNOSIS — N132 Hydronephrosis with renal and ureteral calculous obstruction: Secondary | ICD-10-CM | POA: Diagnosis not present

## 2021-03-11 ENCOUNTER — Other Ambulatory Visit: Payer: Self-pay

## 2021-03-11 ENCOUNTER — Ambulatory Visit
Admission: RE | Admit: 2021-03-11 | Discharge: 2021-03-11 | Disposition: A | Discharge status: Home / Self Care | Payer: Medicare Other | Attending: Urology | Admitting: Urology

## 2021-03-11 ENCOUNTER — Encounter: Payer: Self-pay | Admitting: Urology

## 2021-03-11 ENCOUNTER — Ambulatory Visit
Admission: RE | Admit: 2021-03-11 | Discharge: 2021-03-11 | Disposition: A | Discharge status: Home / Self Care | Payer: Medicare Other | Source: Ambulatory Visit | Attending: Urology | Admitting: Urology

## 2021-03-11 ENCOUNTER — Ambulatory Visit (INDEPENDENT_AMBULATORY_CARE_PROVIDER_SITE_OTHER): Payer: Medicare Other | Admitting: Urology

## 2021-03-11 VITALS — BP 155/73 | HR 75 | Ht 70.0 in | Wt 213.0 lb

## 2021-03-11 DIAGNOSIS — R972 Elevated prostate specific antigen [PSA]: Secondary | ICD-10-CM

## 2021-03-11 DIAGNOSIS — M47816 Spondylosis without myelopathy or radiculopathy, lumbar region: Secondary | ICD-10-CM | POA: Diagnosis not present

## 2021-03-11 DIAGNOSIS — N200 Calculus of kidney: Secondary | ICD-10-CM

## 2021-03-11 NOTE — Progress Notes (Signed)
03/11/2021 2:26 PM   Travis Moore 04/19/1930 [id number]  Referring provider: [name], MD [address] [place],  [place] [address]  Chief Complaint  Patient presents with   Nephrolithiasis    Urologic history: -Elevated PSA (prostate biopsy 1999 for PSA of 4.2 with findings of high-grade PIN.  Follow-up biopsy without evidence of cancer or PIN.  Increasing PSA and surveillance elected  -BPH with mild lower urinary tract symptoms; no treatment   HPI: 87 y.o. presents for annual follow-up.  At last years visit October 2021 PSA had increased to 13.3 and he was noted to have an abnormal DRE We discussed prostate biopsy and he elected not to pursue No bothersome LUTS  Seen [facility] ED 02/24/2021 with right flank pain and hematuria and found to have a 4 mm right UVJ calculus on CT and bilateral, nonobstructing renal calculi Pain resolved the day after the ED visit and he is unaware of passing a stone   PMH: Past Medical History:  Diagnosis Date   Coronary artery disease    Myocardial infarction [facility])    Nephrolithiasis     Surgical History: Past Surgical History:  Procedure Laterality Date   ABDOMINAL SURGERY     CARDIAC SURGERY      Home Medications:  Allergies as of 03/11/2021   No Known Allergies      Medication List        Accurate as of March 11, 2021  2:26 PM. If you have any questions, ask your nurse or doctor.          aspirin EC 81 MG tablet Take 81 mg by mouth daily. Swallow whole.   atorvastatin 20 MG tablet Commonly known as: LIPITOR Take 1 tablet (20 mg total) by mouth daily.   clopidogrel 75 MG tablet Commonly known as: PLAVIX Take 1 tablet (75 mg total) by mouth daily.   losartan 50 MG tablet Commonly known as: COZAAR Take 0.5 tablets (25 mg total) by mouth daily.   losartan-hydrochlorothiazide 50-12.5 MG tablet Commonly known as: HYZAAR Take 1 tablet by mouth daily.   metoprolol succinate 25 MG 24 hr tablet Commonly  known as: TOPROL-XL Take 1 tablet (25 mg total) by mouth daily.        Allergies: No Known Allergies  Family History: History reviewed. No pertinent family history.  Social History:  reports that he has never smoked. He has never used smokeless tobacco. He reports that he does not drink alcohol. No history on file for drug use.   Physical Exam: BP (!) 155/73   Pulse 75   Ht 5\' 10"  (1.778 m)   Wt 213 lb (96.6 kg)   BMI 30.56 kg/m   Constitutional:  Alert and oriented, No acute distress. HEENT: [place] AT, moist mucus membranes.  Trachea midline, no masses. Cardiovascular: No clubbing, cyanosis, or edema. Respiratory: Normal respiratory effort, no increased work of breathing. GI: Abdomen is soft, nontender, nondistended, no abdominal masses GU: Prostate 40 g with firmness left prostate-stable   Pertinent Imaging: CT images were personallly reviewed and interpreted on [facility]  Assessment & Plan:    1.  Right ureteral calculus Asymptomatic, ? passed KUB ordered Will call with results  2. Elevated PSA PSA drawn today and he will be notified with results He continues to lean toward surveillance based on his age  50.  BPH with LUTS Stable    [name], MD  [place] [address] [place], [place] [address] [phone]   

## 2021-03-12 ENCOUNTER — Telehealth: Payer: Self-pay | Admitting: Urology

## 2021-03-12 DIAGNOSIS — R972 Elevated prostate specific antigen [PSA]: Secondary | ICD-10-CM

## 2021-03-12 DIAGNOSIS — N402 Nodular prostate without lower urinary tract symptoms: Secondary | ICD-10-CM

## 2021-03-12 LAB — URINALYSIS, COMPLETE
Bilirubin, UA: NEGATIVE
Glucose, UA: NEGATIVE
Leukocytes,UA: NEGATIVE
Nitrite, UA: NEGATIVE
Protein,UA: NEGATIVE
RBC, UA: NEGATIVE
Specific Gravity, UA: >1.03 — ABNORMAL HIGH (ref 1.005–1.030)
Urobilinogen, Ur: 1 mg/dL (ref 0.2–1.0)
pH, UA: 5.5 (ref 5.0–7.5)

## 2021-03-12 LAB — MICROSCOPIC EXAMINATION
Bacteria, UA: NONE SEEN
Epithelial Cells (non renal): NONE SEEN /hpf (ref 0–10)

## 2021-03-12 LAB — PSA: Prostate Specific Ag, Serum: 19.6 ng/mL — ABNORMAL HIGH (ref 0.0–4.0)

## 2021-03-12 NOTE — Telephone Encounter (Signed)
PSA has increased to 19.6 which is concerning for prostate cancer.  Recommend scheduling CT scan to evaluate for any evidence of metastatic disease.  I placed a CT order and would recommend a follow-up office visit to discuss the results and management options of his elevated PSA.

## 2021-03-13 NOTE — Telephone Encounter (Signed)
Notified patient as instructed, patient pleased. Discussed follow-up appointments, patient agrees, He will call the office to scheduled follow up visit from ct scan results

## 2021-04-01 ENCOUNTER — Other Ambulatory Visit: Payer: Self-pay

## 2021-04-01 ENCOUNTER — Ambulatory Visit
Admission: RE | Admit: 2021-04-01 | Discharge: 2021-04-01 | Disposition: A | Discharge status: Home / Self Care | Payer: Medicare Other | Source: Ambulatory Visit | Attending: Urology | Admitting: Urology

## 2021-04-01 DIAGNOSIS — X32XXXA Exposure to sunlight, initial encounter: Secondary | ICD-10-CM | POA: Diagnosis not present

## 2021-04-01 DIAGNOSIS — Q644 Malformation of urachus: Secondary | ICD-10-CM | POA: Diagnosis not present

## 2021-04-01 DIAGNOSIS — D2262 Melanocytic nevi of left upper limb, including shoulder: Secondary | ICD-10-CM | POA: Diagnosis not present

## 2021-04-01 DIAGNOSIS — N402 Nodular prostate without lower urinary tract symptoms: Secondary | ICD-10-CM | POA: Insufficient documentation

## 2021-04-01 DIAGNOSIS — D485 Neoplasm of uncertain behavior of skin: Secondary | ICD-10-CM | POA: Diagnosis not present

## 2021-04-01 DIAGNOSIS — D225 Melanocytic nevi of trunk: Secondary | ICD-10-CM | POA: Diagnosis not present

## 2021-04-01 DIAGNOSIS — D0439 Carcinoma in situ of skin of other parts of face: Secondary | ICD-10-CM | POA: Diagnosis not present

## 2021-04-01 DIAGNOSIS — D2261 Melanocytic nevi of right upper limb, including shoulder: Secondary | ICD-10-CM | POA: Diagnosis not present

## 2021-04-01 DIAGNOSIS — R972 Elevated prostate specific antigen [PSA]: Secondary | ICD-10-CM | POA: Insufficient documentation

## 2021-04-01 DIAGNOSIS — Z85828 Personal history of other malignant neoplasm of skin: Secondary | ICD-10-CM | POA: Diagnosis not present

## 2021-04-01 DIAGNOSIS — C44319 Basal cell carcinoma of skin of other parts of face: Secondary | ICD-10-CM | POA: Diagnosis not present

## 2021-04-01 DIAGNOSIS — L821 Other seborrheic keratosis: Secondary | ICD-10-CM | POA: Diagnosis not present

## 2021-04-01 DIAGNOSIS — K4020 Bilateral inguinal hernia, without obstruction or gangrene, not specified as recurrent: Secondary | ICD-10-CM | POA: Diagnosis not present

## 2021-04-01 DIAGNOSIS — N200 Calculus of kidney: Secondary | ICD-10-CM | POA: Diagnosis not present

## 2021-04-01 DIAGNOSIS — I700 Atherosclerosis of aorta: Secondary | ICD-10-CM | POA: Diagnosis not present

## 2021-04-01 DIAGNOSIS — L439 Lichen planus, unspecified: Secondary | ICD-10-CM | POA: Diagnosis not present

## 2021-04-01 DIAGNOSIS — L570 Actinic keratosis: Secondary | ICD-10-CM | POA: Diagnosis not present

## 2021-04-03 ENCOUNTER — Other Ambulatory Visit: Payer: Self-pay

## 2021-04-03 ENCOUNTER — Encounter: Payer: Self-pay | Admitting: Urology

## 2021-04-03 ENCOUNTER — Ambulatory Visit (INDEPENDENT_AMBULATORY_CARE_PROVIDER_SITE_OTHER): Payer: Medicare Other | Admitting: Urology

## 2021-04-03 ENCOUNTER — Ambulatory Visit: Payer: Medicare Other | Admitting: Urology

## 2021-04-03 VITALS — BP 134/70 | HR 75 | Ht 70.0 in | Wt 213.0 lb

## 2021-04-03 DIAGNOSIS — R972 Elevated prostate specific antigen [PSA]: Secondary | ICD-10-CM | POA: Diagnosis not present

## 2021-04-03 DIAGNOSIS — N200 Calculus of kidney: Secondary | ICD-10-CM

## 2021-04-03 DIAGNOSIS — N402 Nodular prostate without lower urinary tract symptoms: Secondary | ICD-10-CM | POA: Diagnosis not present

## 2021-04-03 NOTE — Progress Notes (Signed)
04/03/2021 2:03 PM   Travis Moore October 17, 1929 [id number]  Referring provider: [name], MD [address] [place],  [place] [address]  Chief Complaint  Patient presents with   Other    Urologic history: Elevated PSA  prostate biopsy 1999 for PSA of 4.2 with findings of high-grade PIN.  Follow-up biopsy without evidence of cancer or PIN.   Increasing PSA and surveillance elected   2. BPH  Mild LUTS, not bothersome  3.  Nephrolithiasis [facility] ED visit 02/2021 4 mm right distal ureteral calculus; passed on follow-up imaging Nonobstructing bilateral renal calculi CT 03/2021   HPI: 87 y.o. male presents for follow-up.  Seen 02/2021 and PSA increased to 19.6; persistently abnormal DRE Was also seen for a 4 mm right ureteral calculus at [facility] 2022 Follow-up KUB showed no definite calculus but not aware of passing a stone Follow-up CT performed 04/01/2021 showed resolution of his hydronephrosis and right distal ureteral calculus.  Small, bilateral renal calculi.  No evidence of pelvic lymphadenopathy or suggestion of extracapsular prostate cancer Is on Plavix and states he had to stop for a colonoscopy and had a "mild MI" post procedure   PMH: Past Medical History:  Diagnosis Date   Coronary artery disease    Myocardial infarction [facility])    Nephrolithiasis     Surgical History: Past Surgical History:  Procedure Laterality Date   ABDOMINAL SURGERY     CARDIAC SURGERY      Home Medications:  Allergies as of 04/03/2021   No Known Allergies      Medication List        Accurate as of April 03, 2021  2:03 PM. If you have any questions, ask your nurse or doctor.          aspirin EC 81 MG tablet Take 81 mg by mouth daily. Swallow whole.   atorvastatin 20 MG tablet Commonly known as: LIPITOR Take 1 tablet (20 mg total) by mouth daily.   clopidogrel 75 MG tablet Commonly known as: PLAVIX Take 1 tablet (75 mg total) by mouth daily.   losartan 50 MG  tablet Commonly known as: COZAAR Take 0.5 tablets (25 mg total) by mouth daily.   losartan-hydrochlorothiazide 50-12.5 MG tablet Commonly known as: HYZAAR Take 1 tablet by mouth daily.   metoprolol succinate 25 MG 24 hr tablet Commonly known as: TOPROL-XL Take 1 tablet (25 mg total) by mouth daily.        Allergies: No Known Allergies  Family History: History reviewed. No pertinent family history.  Social History:  reports that he has never smoked. He has never used smokeless tobacco. He reports that he does not drink alcohol. No history on file for drug use.   Physical Exam: BP 134/70   Pulse 75   Ht 5\' 10"  (1.778 m)   Wt 213 lb (96.6 kg)   BMI 30.56 kg/m   Constitutional:  Alert and oriented, No acute distress. HEENT:  AT, moist mucus membranes.  Trachea midline, no masses. Cardiovascular: No clubbing, cyanosis, or edema. Psychiatric: Normal mood and affect.   Assessment & Plan:    1.  Elevated PSA/abnormal DRE Rising PSA and abnormal DRE suspicious for prostate cancer The abnormal DRE recently noted could be an indication of high risk disease We discussed prostate biopsy which does have increased risk as it could not be performed without stopping the Plavix.  If he elected to pursue biopsy he may need a Lovenox bridge His daughter states his father lived to  be 99 and they would like to pursue prostate biopsy Will obtain cardiology clearance to hold Plavix and need for Lovenox bridge The procedure was discussed in detail including potential risks of bleeding and infection/sepsis  2.  Nephrolithiasis Passed ureteral calculus Bilateral, nonobstructing renal calculi   [name], MD  [place] [address] [place], [place] [address] [phone]

## 2021-04-16 ENCOUNTER — Ambulatory Visit: Payer: Medicare Other | Admitting: Urology

## 2021-04-24 ENCOUNTER — Telehealth: Payer: Self-pay | Admitting: Family Medicine

## 2021-04-24 NOTE — Telephone Encounter (Signed)
Dr. Lavera Guise has cleared patient to have a Prostate Biopsy. Patient states he recently had a heart attack and has had to be on Lovenox bridge but this was not requested on the clearance paper that was faxed. Patient states he would like to talk to Dr. Lavera Guise before he schedules the Biopsy.

## 2021-04-30 NOTE — Telephone Encounter (Signed)
Patient returned your call.  His wife wanted to let you know that he is to see his cardio doctor on 05/18/21

## 2021-05-18 ENCOUNTER — Other Ambulatory Visit: Payer: Self-pay | Admitting: *Deleted

## 2021-05-18 ENCOUNTER — Ambulatory Visit (INDEPENDENT_AMBULATORY_CARE_PROVIDER_SITE_OTHER): Payer: Medicare Other | Admitting: Internal Medicine

## 2021-05-18 ENCOUNTER — Encounter: Payer: Self-pay | Admitting: Internal Medicine

## 2021-05-18 ENCOUNTER — Other Ambulatory Visit: Payer: Self-pay

## 2021-05-18 VITALS — BP 140/80 | HR 68 | Ht 70.0 in | Wt 220.1 lb

## 2021-05-18 DIAGNOSIS — I2510 Atherosclerotic heart disease of native coronary artery without angina pectoris: Secondary | ICD-10-CM | POA: Diagnosis not present

## 2021-05-18 DIAGNOSIS — E785 Hyperlipidemia, unspecified: Secondary | ICD-10-CM

## 2021-05-18 DIAGNOSIS — I10 Essential (primary) hypertension: Secondary | ICD-10-CM | POA: Diagnosis not present

## 2021-05-18 DIAGNOSIS — N400 Enlarged prostate without lower urinary tract symptoms: Secondary | ICD-10-CM | POA: Diagnosis not present

## 2021-05-18 DIAGNOSIS — R972 Elevated prostate specific antigen [PSA]: Secondary | ICD-10-CM

## 2021-05-18 DIAGNOSIS — R42 Dizziness and giddiness: Secondary | ICD-10-CM

## 2021-05-18 MED ORDER — ENOXAPARIN SODIUM 60 MG/0.6ML IJ SOSY: 60.0000 mg | PREFILLED_SYRINGE | Freq: Two times a day (BID) | INTRAMUSCULAR | 0 refills | Status: DC

## 2021-05-18 MED ORDER — ENOXAPARIN SODIUM 60 MG/0.6ML IJ SOSY: 60.0000 mg | PREFILLED_SYRINGE | Freq: Two times a day (BID) | INTRAMUSCULAR | 0 refills | Status: AC

## 2021-05-18 NOTE — Assessment & Plan Note (Signed)
Patient denies any chest pain or shortness of breath.  He has a history of heart attack 5 years ago.

## 2021-05-18 NOTE — Assessment & Plan Note (Signed)

## 2021-05-18 NOTE — Assessment & Plan Note (Signed)
Patient is considering biopsy of the prostate by Dr. Bernardo Heater.

## 2021-05-18 NOTE — Progress Notes (Signed)
Established Patient Office Visit  Subjective:  Patient ID: Travis Moore, male    DOB: 01-18-1930  Age: 87 y.o. MRN: [id number]  CC:  Chief Complaint  Patient presents with   Pre-op Exam    Patient is here to discuss stopping his blood thinners due to upcoming prostate biopsy procedure    HPI  Travis Moore presents for medical clearance, patient has a history of mild heart attack 5 years ago.  At the present time he does not have any chest pain palpitation or passing out spell.  He is going to have a urological surgery and we will going to switch him from Plavix to Lovenox bridging while he was taken off of Plavix before the surgery, he was started 60 mg of Lovenox twice a day subcutaneously.  And his wife is going to administer it, he does not smoke does not drink   Past Medical History:  Diagnosis Date   Coronary artery disease    Myocardial infarction [facility])    Nephrolithiasis     Past Surgical History:  Procedure Laterality Date   ABDOMINAL SURGERY     CARDIAC SURGERY      History reviewed. No pertinent family history.  Social History   Socioeconomic History   Marital status: Married    Spouse name: Not on file   Number of children: Not on file   Years of education: Not on file   Highest education level: Not on file  Occupational History   Not on file  Tobacco Use   Smoking status: Never   Smokeless tobacco: Never  Substance and Sexual Activity   Alcohol use: No   Drug use: Not on file   Sexual activity: Not on file  Other Topics Concern   Not on file  Social History Narrative   Not on file   Social Determinants of Health   Financial Resource Strain: Not on file  Food Insecurity: Not on file  Transportation Needs: Not on file  Physical Activity: Not on file  Stress: Not on file  Social Connections: Not on file  Intimate Partner Violence: Not on file     Current Outpatient Medications:    aspirin EC 81 MG tablet, Take 81 mg by  mouth daily. Swallow whole., Disp: , Rfl:    atorvastatin (LIPITOR) 20 MG tablet, Take 1 tablet (20 mg total) by mouth daily., Disp: 90 tablet, Rfl: 3   clopidogrel (PLAVIX) 75 MG tablet, Take 1 tablet (75 mg total) by mouth daily., Disp: 90 tablet, Rfl: 3   enoxaparin (LOVENOX) 60 MG/0.6ML injection, Inject 0.6 mLs (60 mg total) into the skin every 12 (twelve) hours., Disp: 20 mL, Rfl: 0   losartan (COZAAR) 50 MG tablet, Take 0.5 tablets (25 mg total) by mouth daily., Disp: 90 tablet, Rfl: 3   metoprolol succinate (TOPROL-XL) 25 MG 24 hr tablet, Take 1 tablet (25 mg total) by mouth daily., Disp: 90 tablet, Rfl: 3   No Known Allergies  ROS Review of Systems  Constitutional: Negative.   HENT: Negative.    Eyes: Negative.   Respiratory: Negative.    Cardiovascular: Negative.   Gastrointestinal: Negative.   Endocrine: Negative.   Genitourinary: Negative.   Musculoskeletal: Negative.   Skin: Negative.   Allergic/Immunologic: Negative.   Neurological: Negative.   Hematological: Negative.   Psychiatric/Behavioral: Negative.    All other systems reviewed and are negative.    Objective:    Physical Exam Vitals reviewed.  Constitutional:  Appearance: Normal appearance.  HENT:     Mouth/Throat:     Mouth: Mucous membranes are moist.  Eyes:     Pupils: Pupils are equal, round, and reactive to light.  Neck:     Vascular: No carotid bruit.  Cardiovascular:     Rate and Rhythm: Normal rate and regular rhythm.     Pulses: Normal pulses.     Heart sounds: Normal heart sounds.  Pulmonary:     Effort: Pulmonary effort is normal.     Breath sounds: Normal breath sounds.  Abdominal:     General: Bowel sounds are normal.     Palpations: Abdomen is soft. There is no hepatomegaly, splenomegaly or mass.     Tenderness: There is no abdominal tenderness.     Hernia: No hernia is present.  Musculoskeletal:     Cervical back: Neck supple.     Right lower leg: No edema.     Left lower  leg: No edema.  Skin:    Findings: No rash.  Neurological:     Mental Status: He is alert and oriented to person, place, and time.     Motor: No weakness.  Psychiatric:        Mood and Affect: Mood normal.        Behavior: Behavior normal.    BP 140/80   Pulse 68   Ht 5\' 10"  (1.778 m)   Wt 220 lb 1.6 oz (99.8 kg)   BMI 31.58 kg/m  Wt Readings from Last 3 Encounters:  05/18/21 220 lb 1.6 oz (99.8 kg)  04/03/21 213 lb (96.6 kg)  03/11/21 213 lb (96.6 kg)     Health Maintenance Due  Topic Date Due   TETANUS/TDAP  08/28/2012   Zoster Vaccines- Shingrix (2 of 2) 11/28/2012   Pneumonia Vaccine 18+ Years old (2 - PCV) 10/25/2015   COVID-19 Vaccine (4 - Booster for Pfizer series) 05/16/2020   INFLUENZA VACCINE  Never done    There are no preventive care reminders to display for this patient.  No results found for: TSH Lab Results  Component Value Date   WBC 6.4 06/13/2016   HGB 16.7 06/13/2016   HCT 48.3 06/13/2016   MCV 97.4 06/13/2016   PLT 241 06/13/2016   Lab Results  Component Value Date   NA 139 06/13/2016   K 4.6 06/13/2016   CO2 26 06/13/2016   GLUCOSE 118 (H) 06/13/2016   BUN 12 06/13/2016   CREATININE 0.92 06/13/2016   CALCIUM 9.5 06/13/2016   ANIONGAP 7 06/13/2016   No results found for: CHOL No results found for: HDL No results found for: LDLCALC No results found for: TRIG No results found for: CHOLHDL No results found for: HGBA1C    Assessment & Plan:   Problem List Items Addressed This Visit       Cardiovascular and Mediastinum   Coronary artery disease involving native coronary artery of native heart without angina pectoris    Patient denies any chest pain or shortness of breath.  He has a history of heart attack 5 years ago.      Relevant Medications   enoxaparin (LOVENOX) 60 MG/0.6ML injection   Essential hypertension - Primary     Patient denies any chest pain or shortness of breath there is no history of palpitation or paroxysmal  nocturnal dyspnea   patient was advised to follow low-salt low-cholesterol diet    ideally I want to keep systolic blood pressure below 130 mmHg, patient was asked  to check blood pressure one times a week and give me a report on that.  Patient will be follow-up in 3 months  or earlier as needed, patient will call me back for any change in the cardiovascular symptoms Patient was advised to buy a book from local bookstore concerning blood pressure and read several chapters  every day.  This will be supplemented by some of the material we will give him from the office.  Patient should also utilize other resources like YouTube and Internet to learn more about the blood pressure and the diet.      Relevant Medications   enoxaparin (LOVENOX) 60 MG/0.6ML injection     Genitourinary   BPH with elevated PSA    Patient is considering biopsy of the prostate by Dr. Bernardo Heater.        Other   Dizziness and giddiness   Dyslipidemia    Patient takes his statin regularly.       Meds ordered this encounter  Medications   enoxaparin (LOVENOX) 60 MG/0.6ML injection    Sig: Inject 0.6 mLs (60 mg total) into the skin every 12 (twelve) hours.    Dispense:  20 mL    Refill:  0    Follow-up: No follow-ups on file.    [name], MD

## 2021-05-18 NOTE — Assessment & Plan Note (Signed)
Patient takes his statin regularly.

## 2021-05-19 DIAGNOSIS — C44319 Basal cell carcinoma of skin of other parts of face: Secondary | ICD-10-CM | POA: Diagnosis not present

## 2021-05-19 DIAGNOSIS — L905 Scar conditions and fibrosis of skin: Secondary | ICD-10-CM | POA: Diagnosis not present

## 2021-05-26 DIAGNOSIS — D0439 Carcinoma in situ of skin of other parts of face: Secondary | ICD-10-CM | POA: Diagnosis not present

## 2021-06-30 ENCOUNTER — Other Ambulatory Visit: Payer: Self-pay | Admitting: Internal Medicine

## 2021-07-02 ENCOUNTER — Ambulatory Visit (INDEPENDENT_AMBULATORY_CARE_PROVIDER_SITE_OTHER): Payer: Medicare Other | Admitting: *Deleted

## 2021-07-02 DIAGNOSIS — Z0000 Encounter for general adult medical examination without abnormal findings: Secondary | ICD-10-CM

## 2021-07-02 DIAGNOSIS — Z23 Encounter for immunization: Secondary | ICD-10-CM

## 2021-07-02 MED ORDER — ZOSTER VAC RECOMB ADJUVANTED 50 MCG/0.5ML IM SUSR: 0.5000 mL | Freq: Once | INTRAMUSCULAR | 1 refills | Status: AC

## 2021-07-02 NOTE — Progress Notes (Signed)
Subjective:   Travis Moore is a 88 y.o. male who presents for Medicare Annual/Subsequent preventive examination.  I discussed the limitations of evaluation and management by telemedicine and the availability of in person appointments. Patient expressed understanding and agreed to proceed.   Visit performed using audio  Patient:home Provider:home   Review of Systems    Defer to provider       Objective:    There were no vitals filed for this visit. There is no height or weight on file to calculate BMI.  Advanced Directives 06/13/2016  Does Patient Have a Medical Advance Directive? No  Would patient like information on creating a medical advance directive? Yes (ED - Information included in AVS)    Current Medications (verified) Outpatient Encounter Medications as of 07/02/2021  Medication Sig   aspirin EC 81 MG tablet Take 81 mg by mouth daily. Swallow whole.   atorvastatin (LIPITOR) 20 MG tablet Take 1 tablet (20 mg total) by mouth daily.   clopidogrel (PLAVIX) 75 MG tablet Take 1 tablet (75 mg total) by mouth daily.   enoxaparin (LOVENOX) 60 MG/0.6ML injection Inject 0.6 mLs (60 mg total) into the skin every 12 (twelve) hours.   losartan (COZAAR) 50 MG tablet Take 0.5 tablets (25 mg total) by mouth daily.   metoprolol succinate (TOPROL-XL) 25 MG 24 hr tablet Take 1 tablet (25 mg total) by mouth daily.   No facility-administered encounter medications on file as of 07/02/2021.    Allergies (verified) Patient has no known allergies.   History: Past Medical History:  Diagnosis Date   Coronary artery disease    Myocardial infarction [facility])    Nephrolithiasis    Past Surgical History:  Procedure Laterality Date   ABDOMINAL SURGERY     CARDIAC SURGERY     No family history on file. Social History   Socioeconomic History   Marital status: Married    Spouse name: Not on file   Number of children: Not on file   Years of education: Not on file   Highest  education level: Not on file  Occupational History   Not on file  Tobacco Use   Smoking status: Never   Smokeless tobacco: Never  Substance and Sexual Activity   Alcohol use: No   Drug use: Not on file   Sexual activity: Not Currently  Other Topics Concern   Not on file  Social History Narrative   Not on file   Social Determinants of Health   Financial Resource Strain: Low Risk    Difficulty of Paying Living Expenses: Not hard at all  Food Insecurity: No Food Insecurity   Worried About Running Out of Food in the Last Year: Never true   [place] in the Last Year: Never true  Transportation Needs: No Transportation Needs   Lack of Transportation (Medical): No   Lack of Transportation (Non-Medical): No  Physical Activity: Insufficiently Active   Days of Exercise per Week: 3 days   Minutes of Exercise per Session: 30 min  Stress: No Stress Concern Present   Feeling of Stress : Not at all  Social Connections: Socially Integrated   Frequency of Communication with Friends and Family: More than three times a week   Frequency of Social Gatherings with Friends and Family: More than three times a week   Attends Religious Services: More than 4 times per year   Active Member of [employer] or Organizations: No   Attends Music therapist: More  than 4 times per year   Marital Status: Married    Tobacco Counseling Counseling given: Not Answered   Clinical Intake:  Pre-visit preparation completed: Yes  Pain : No/denies pain     Nutritional Risks: None Diabetes: No  How often do you need to have someone help you when you read instructions, pamphlets, or other written materials from your doctor or pharmacy?: 1 - Never What is the last grade level you completed in school?: 12  Diabetic?no     Information entered by :: Lacretia Nicks, CMA   Activities of Daily Living No flowsheet data found.  Patient Care Team: [name], MD as PCP - General (Internal  Medicine)  Indicate any recent Medical Services you may have received from other than [facility] providers in the past year (date may be approximate).     Assessment:   This is a routine wellness examination for [place].  Hearing/Vision screen No results found.  Dietary issues and exercise activities discussed:     Goals Addressed   None    Depression Screen PHQ 2/9 Scores 07/02/2021 12/05/2019  PHQ - 2 Score 0 0  PHQ- 9 Score - 1    Fall Risk Fall Risk  12/05/2019 01/19/2019  Falls in the past year? 0 (No Data)  Comment - Emmi Telephone Survey: data to providers prior to load  Number falls in past yr: - (No Data)  Comment - Emmi Telephone Survey Actual Response =     FALL RISK PREVENTION PERTAINING TO THE HOME:  Any stairs in or around the home? No  If so, are there any without handrails? No  Home free of loose throw rugs in walkways, pet beds, electrical cords, etc? Yes  Adequate lighting in your home to reduce risk of falls? Yes   ASSISTIVE DEVICES UTILIZED TO PREVENT FALLS:  Life alert? No  Use of a cane, walker or w/c? No  Grab bars in the bathroom? No  Shower chair or bench in shower? No  Elevated toilet seat or a handicapped toilet? No   TIMED UP AND GO:  Was the test performed? no.  Length of time to ambulate- NA    Cognitive Function:        Immunizations Immunization History  Administered Date(s) Administered   PFIZER(Purple Top)SARS-COV-2 Vaccination 07/18/2019, 08/08/2019, 03/21/2020   Pneumococcal Polysaccharide-23 10/25/2014   Td 08/29/2002   Zoster Recombinat (Shingrix) 10/03/2012    TDAP status: Due, Education has been provided regarding the importance of this vaccine. Advised may receive this vaccine at local pharmacy or Health Dept. Aware to provide a copy of the vaccination record if obtained from local pharmacy or Health Dept. Verbalized acceptance and understanding.  Flu Vaccine status: Up to date  Pneumococcal vaccine status: Due,  Education has been provided regarding the importance of this vaccine. Advised may receive this vaccine at local pharmacy or Health Dept. Aware to provide a copy of the vaccination record if obtained from local pharmacy or Health Dept. Verbalized acceptance and understanding.  Information provided on how to obtain Covid vaccines   Qualifies for Shingles Vaccine? Yes   Zostavax completed No   Shingrix Completed?: No.    Education has been provided regarding the importance of this vaccine. Patient has been advised to call insurance company to determine out of pocket expense if they have not yet received this vaccine. Advised may also receive vaccine at local pharmacy or Health Dept. Verbalized acceptance and understanding.  Screening Tests Health Maintenance  Topic Date Due  TETANUS/TDAP  08/28/2012   Zoster Vaccines- Shingrix (2 of 2) 11/28/2012   Pneumonia Vaccine 74+ Years old (2 - PCV) 10/25/2015   COVID-19 Vaccine (4 - Booster for Pfizer series) 07/18/2021 (Originally 05/16/2020)   INFLUENZA VACCINE  09/18/2021 (Originally 01/19/2021)   HPV VACCINES  Aged Out    Health Maintenance  Health Maintenance Due  Topic Date Due   TETANUS/TDAP  08/28/2012   Zoster Vaccines- Shingrix (2 of 2) 11/28/2012   Pneumonia Vaccine 22+ Years old (2 - PCV) 10/25/2015    Colorectal cancer screening: No longer required.   Lung Cancer Screening: (Low Dose CT Chest recommended if Age 38-80 years, 30 pack-year currently smoking OR have quit w/in 15years.) does not qualify.   Lung Cancer Screening Referral: NA  Additional Screening:  Hepatitis C Screening: does not qision    Screening: Recommended annual ophthalmology exams for early detection of glaucoma and other disorders of the eye. Is the patient up to date with their annual eye exam?  Yes  Who is the provider or what is the name of the office in which the patient attends annual eye exams? [place]  If pt is not established with a provider,  would they like to be referred to a provider to establish care? No .   Dental Screening: Recommended annual dental exams for proper oral hygiene  Community Resource Referral / Chronic Care Management: CRR required this visit?  No   CCM required this visit?  No      Plan:     I have personally reviewed and noted the following in the patients chart:   Medical and social history Use of alcohol, tobacco or illicit drugs  Current medications and supplements including opioid prescriptions. Patient is not currently taking opioid prescriptions. Functional ability and status Nutritional status Physical activity Advanced directives List of other physicians Hospitalizations, surgeries, and ER visits in previous 12 months Vitals Screenings to include cognitive, depression, and falls Referrals and appointments  In addition, I have reviewed and discussed with patient certain preventive protocols, quality metrics, and best practice recommendations. A written personalized care plan for preventive services as well as general preventive health recommendations were provided to patient.     Lacretia Nicks, [place]   07/02/2021   Nurse Notes:  Mr. Pitta , Thank you for taking time to come for your Medicare Wellness Visit. I appreciate your ongoing commitment to your health goals. Please review the following plan we discussed and let me know if I can assist you in the future.   These are the goals we discussed:  Goals   None     This is a list of the screening recommended for you and due dates:  Health Maintenance  Topic Date Due   Tetanus Vaccine  08/28/2012   Zoster (Shingles) Vaccine (2 of 2) 11/28/2012   Pneumonia Vaccine (2 - PCV) 10/25/2015   COVID-19 Vaccine (4 - Booster for Pfizer series) 07/18/2021*   Flu Shot  09/18/2021*   HPV Vaccine  Aged Out  *Topic was postponed. The date shown is not the original due date.

## 2021-07-02 NOTE — Progress Notes (Signed)
I have reviewed this visit and agree with the documentation.   

## 2021-09-15 ENCOUNTER — Other Ambulatory Visit: Payer: Self-pay

## 2021-09-29 ENCOUNTER — Other Ambulatory Visit: Payer: Self-pay | Admitting: Internal Medicine

## 2021-12-14 ENCOUNTER — Other Ambulatory Visit: Payer: Self-pay | Admitting: Internal Medicine

## 2021-12-17 IMAGING — CR DG ABDOMEN 1V
2 series · 2 of 2 positions shown · non-contrast
Comparison: By report with prior CT from 02/25/2021 at [HOSPITAL]

CLINICAL DATA: History of kidney stone on the right, initial
encounter

EXAM:
ABDOMEN - 1 VIEW

[abdomen kub (1 of 2)]
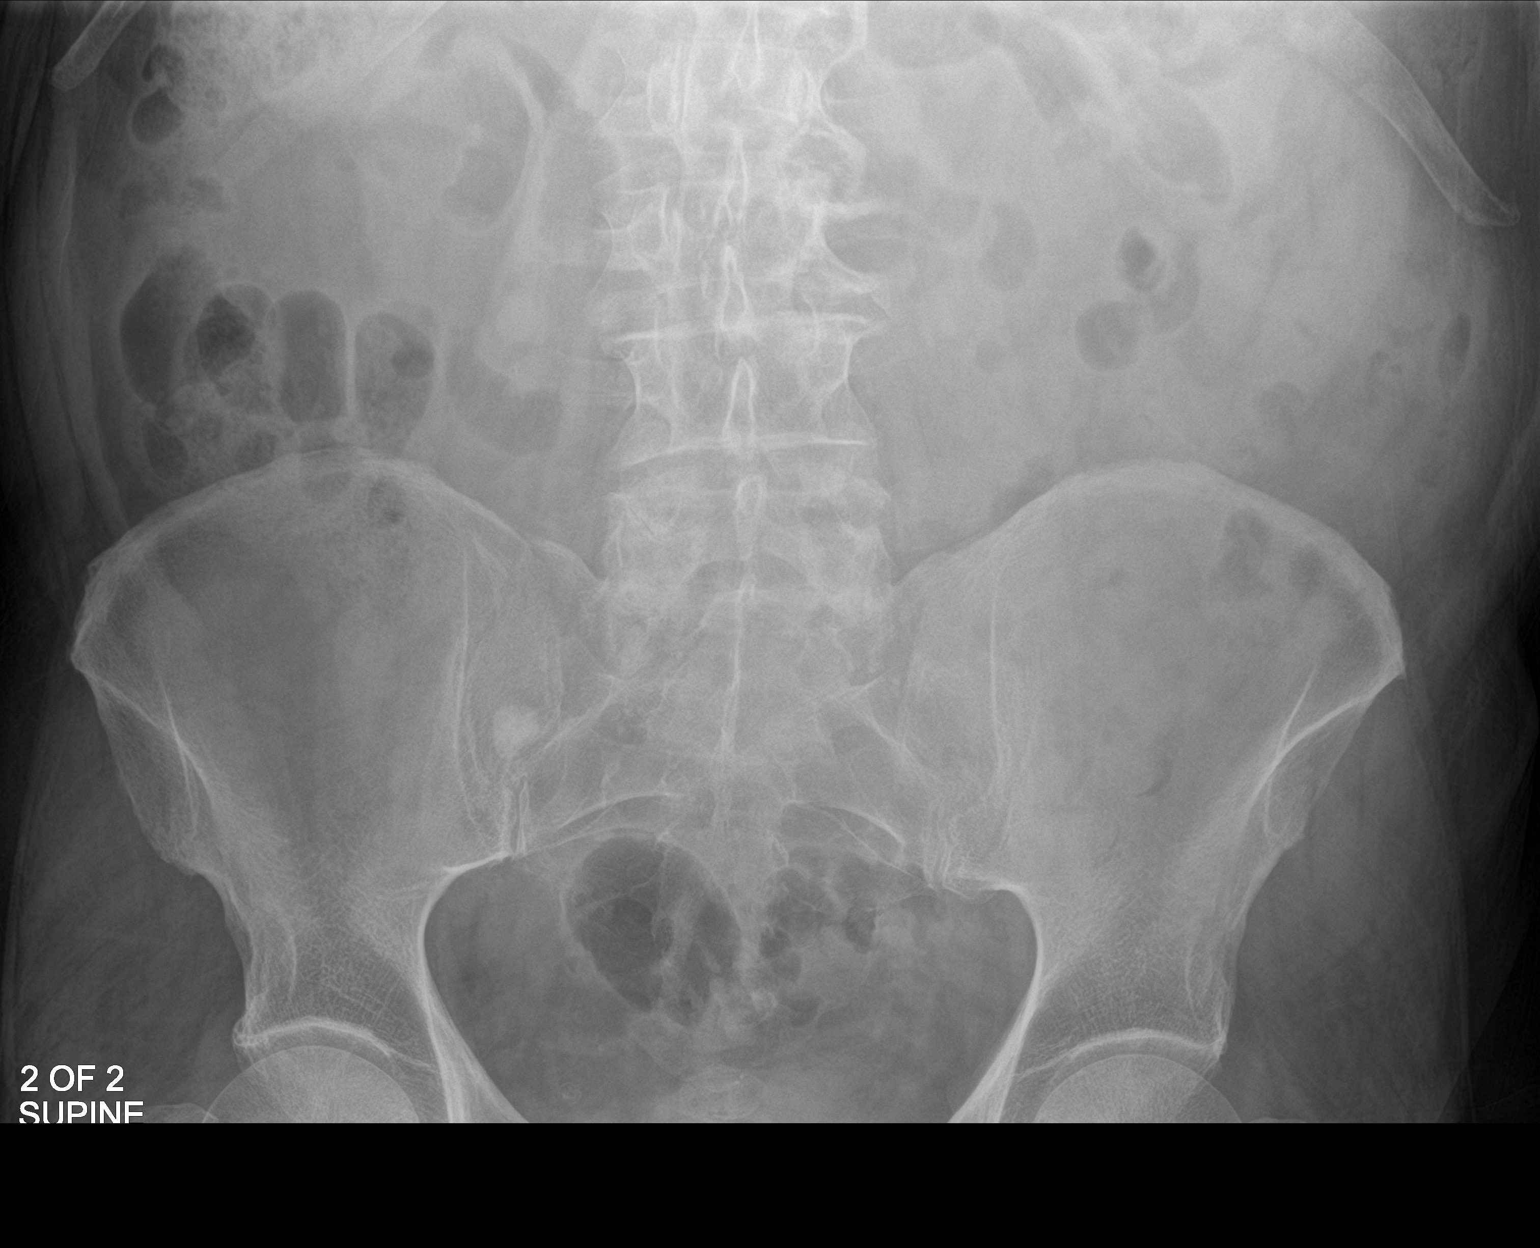

[abdomen kub (2 of 2)]
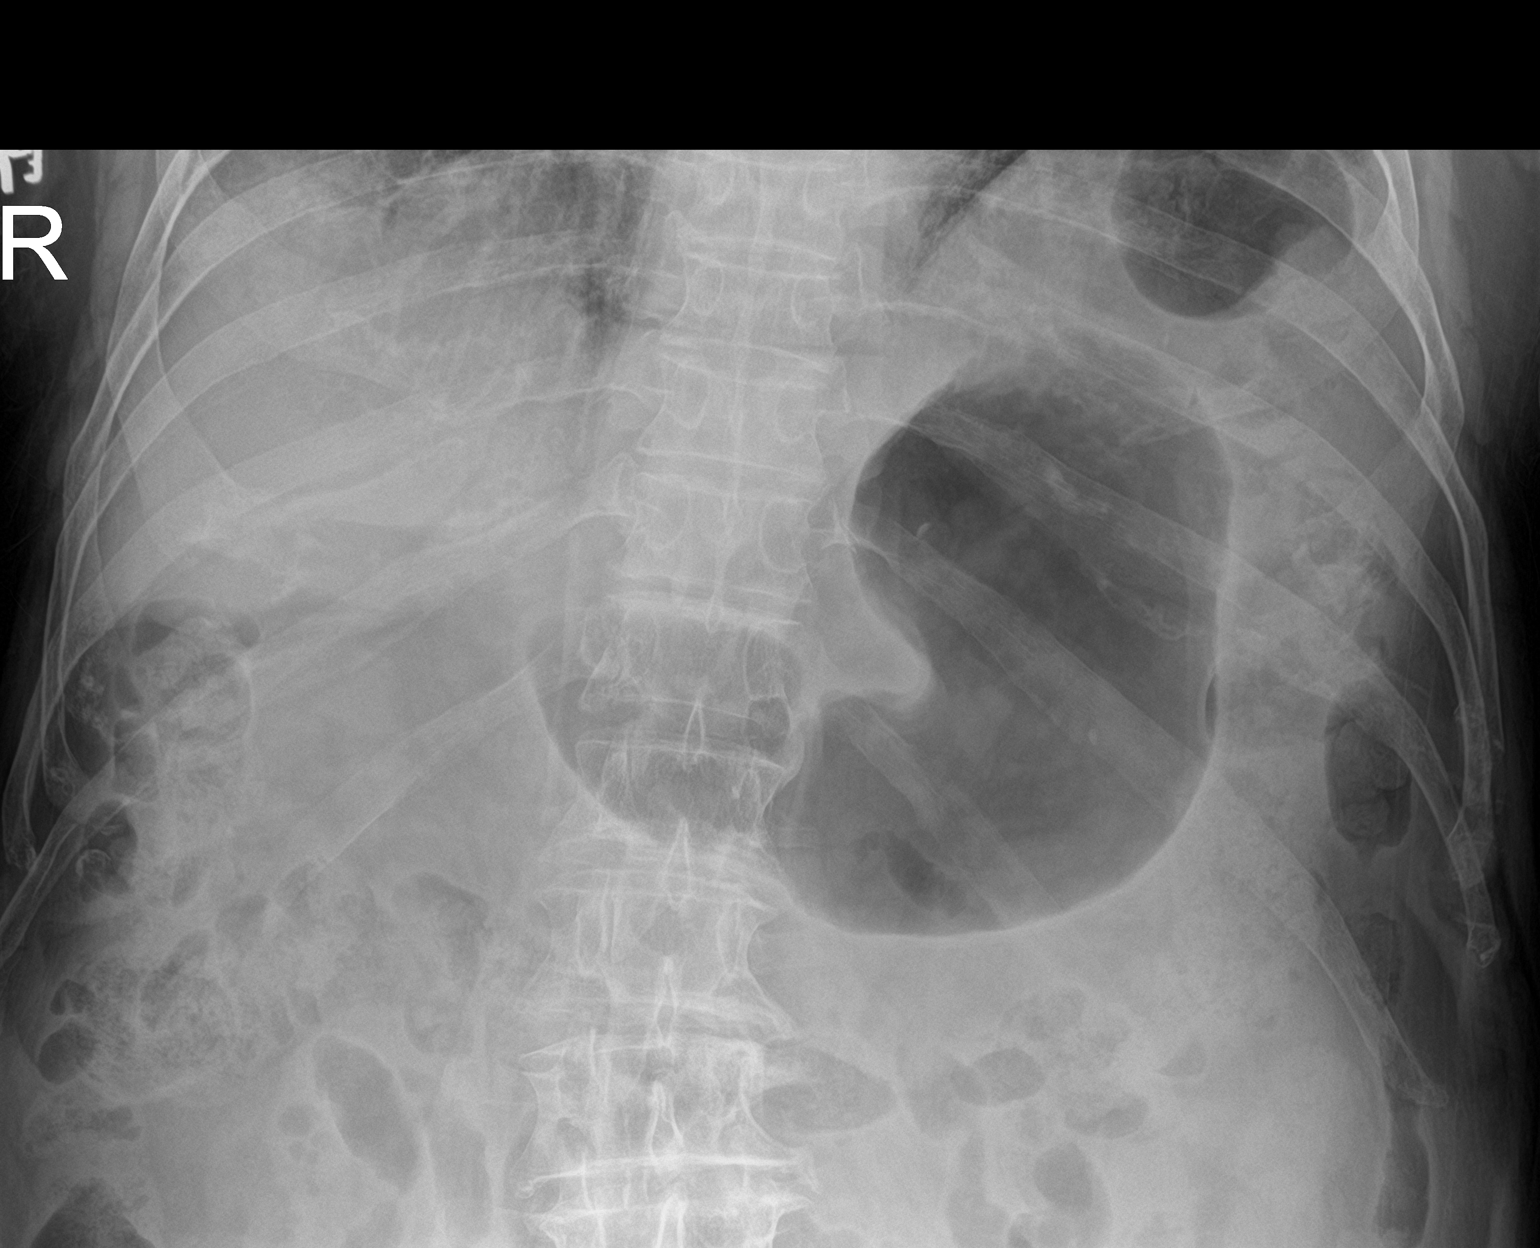

[2 of 2 positions shown; findings below may reference images not displayed]

FINDINGS: Scattered large and small bowel gas is noted. 4 mm calcification is
noted in the expected region of the left kidney. The right kidney is
somewhat obscured on this examination. By history the patient had a
right UVJ stone and no definitive 4 mm calcification is identified
to correspond with that finding. Calcification is noted over the
right sacroiliac joint likely related to a bone island. Degenerative
changes of lumbar spine are noted.
IMPRESSION: Previously described right UVJ stone is not well visualized on this
exam. A small nonobstructing 4 mm left renal stone is seen.

Right kidney is somewhat obscured.

## 2022-03-11 ENCOUNTER — Other Ambulatory Visit: Payer: Self-pay | Admitting: Internal Medicine

## 2022-06-16 ENCOUNTER — Other Ambulatory Visit: Payer: Self-pay | Admitting: Internal Medicine
